# Patient Record
Sex: Female | Born: 1965 | Race: Black or African American | Hispanic: No | Marital: Single | Smoking: Never smoker
Health system: Southern US, Community
[De-identification: ages and names within clinical notes are randomized; demographics above are authoritative.]

## PROBLEM LIST (undated history)

## (undated) DIAGNOSIS — I10 Essential (primary) hypertension: Secondary | ICD-10-CM

## (undated) DIAGNOSIS — F419 Anxiety disorder, unspecified: Secondary | ICD-10-CM

## (undated) DIAGNOSIS — E119 Type 2 diabetes mellitus without complications: Secondary | ICD-10-CM

## (undated) DIAGNOSIS — N390 Urinary tract infection, site not specified: Secondary | ICD-10-CM

## (undated) HISTORY — PX: TOE SURGERY: SHX1073

## (undated) HISTORY — PX: CHOLECYSTECTOMY: SHX55

---

## 1998-06-18 ENCOUNTER — Other Ambulatory Visit: Admission: RE | Admit: 1998-06-18 | Discharge: 1998-06-18 | Payer: Self-pay | Admitting: Gynecology

## 1999-07-14 ENCOUNTER — Other Ambulatory Visit: Admission: RE | Admit: 1999-07-14 | Discharge: 1999-07-14 | Payer: Self-pay | Admitting: Gynecology

## 1999-12-16 ENCOUNTER — Other Ambulatory Visit: Admission: RE | Admit: 1999-12-16 | Discharge: 1999-12-16 | Payer: Self-pay | Admitting: Gynecology

## 2000-07-31 ENCOUNTER — Emergency Department (HOSPITAL_COMMUNITY): Admission: EM | Admit: 2000-07-31 | Discharge: 2000-08-01 | Payer: Self-pay | Admitting: Emergency Medicine

## 2000-08-02 ENCOUNTER — Encounter: Admission: RE | Admit: 2000-08-02 | Discharge: 2000-08-02 | Payer: Self-pay | Admitting: Internal Medicine

## 2000-08-02 ENCOUNTER — Encounter: Payer: Self-pay | Admitting: Internal Medicine

## 2000-08-09 ENCOUNTER — Other Ambulatory Visit: Admission: RE | Admit: 2000-08-09 | Discharge: 2000-08-09 | Payer: Self-pay | Admitting: Gynecology

## 2001-08-22 ENCOUNTER — Ambulatory Visit (HOSPITAL_COMMUNITY): Admission: RE | Admit: 2001-08-22 | Discharge: 2001-08-22 | Payer: Self-pay | Admitting: Gastroenterology

## 2001-09-11 ENCOUNTER — Other Ambulatory Visit: Admission: RE | Admit: 2001-09-11 | Discharge: 2001-09-11 | Payer: Self-pay | Admitting: Gynecology

## 2002-03-11 ENCOUNTER — Encounter: Admission: RE | Admit: 2002-03-11 | Discharge: 2002-06-09 | Payer: Self-pay | Admitting: Internal Medicine

## 2002-10-07 ENCOUNTER — Other Ambulatory Visit: Admission: RE | Admit: 2002-10-07 | Discharge: 2002-10-07 | Payer: Self-pay | Admitting: Gynecology

## 2002-10-10 ENCOUNTER — Encounter: Admission: RE | Admit: 2002-10-10 | Discharge: 2002-10-10 | Payer: Self-pay | Admitting: Gynecology

## 2002-10-10 ENCOUNTER — Encounter: Payer: Self-pay | Admitting: Gynecology

## 2002-11-15 ENCOUNTER — Emergency Department (HOSPITAL_COMMUNITY): Admission: EM | Admit: 2002-11-15 | Discharge: 2002-11-15 | Payer: Self-pay | Admitting: Emergency Medicine

## 2006-07-14 ENCOUNTER — Ambulatory Visit: Payer: Self-pay | Admitting: Nurse Practitioner

## 2006-08-04 ENCOUNTER — Ambulatory Visit: Payer: Self-pay | Admitting: Nurse Practitioner

## 2006-08-04 ENCOUNTER — Encounter (INDEPENDENT_AMBULATORY_CARE_PROVIDER_SITE_OTHER): Payer: Self-pay | Admitting: Nurse Practitioner

## 2006-08-09 ENCOUNTER — Ambulatory Visit (HOSPITAL_COMMUNITY): Admission: RE | Admit: 2006-08-09 | Discharge: 2006-08-09 | Payer: Self-pay | Admitting: Family Medicine

## 2006-08-10 ENCOUNTER — Ambulatory Visit: Payer: Self-pay | Admitting: *Deleted

## 2008-05-06 ENCOUNTER — Encounter (INDEPENDENT_AMBULATORY_CARE_PROVIDER_SITE_OTHER): Payer: Self-pay | Admitting: Internal Medicine

## 2008-05-06 ENCOUNTER — Ambulatory Visit: Payer: Self-pay | Admitting: Internal Medicine

## 2008-05-06 LAB — CONVERTED CEMR LAB
Cholesterol: 192 mg/dL (ref 0–200)
HDL: 72 mg/dL (ref >39)
LDL Cholesterol: 107 mg/dL — ABNORMAL HIGH (ref 0–99)
Microalb, Ur: 1.93 mg/dL — ABNORMAL HIGH (ref 0.00–1.89)
TSH: 1.939 microintl units/mL (ref 0.350–4.50)
Total CHOL/HDL Ratio: 2.7
Triglycerides: 64 mg/dL (ref <150)
VLDL: 13 mg/dL (ref 0–40)

## 2008-06-06 ENCOUNTER — Ambulatory Visit (HOSPITAL_COMMUNITY): Admission: RE | Admit: 2008-06-06 | Discharge: 2008-06-06 | Payer: Self-pay | Admitting: Internal Medicine

## 2008-12-04 ENCOUNTER — Ambulatory Visit: Payer: Self-pay | Admitting: Internal Medicine

## 2008-12-05 ENCOUNTER — Ambulatory Visit: Payer: Self-pay | Admitting: Internal Medicine

## 2008-12-05 ENCOUNTER — Encounter (INDEPENDENT_AMBULATORY_CARE_PROVIDER_SITE_OTHER): Payer: Self-pay | Admitting: Family Medicine

## 2008-12-05 LAB — CONVERTED CEMR LAB
ALT: 20 units/L (ref 0–35)
AST: 21 units/L (ref 0–37)
Albumin: 4.2 g/dL (ref 3.5–5.2)
Alkaline Phosphatase: 52 units/L (ref 39–117)
BUN: 14 mg/dL (ref 6–23)
CO2: 21 meq/L (ref 19–32)
Calcium: 9 mg/dL (ref 8.4–10.5)
Chloride: 105 meq/L (ref 96–112)
Creatinine, Ser: 0.99 mg/dL (ref 0.40–1.20)
Glucose, Bld: 92 mg/dL (ref 70–99)
Potassium: 4.2 meq/L (ref 3.5–5.3)
Sodium: 139 meq/L (ref 135–145)
Total Bilirubin: 0.3 mg/dL (ref 0.3–1.2)
Total Protein: 6.9 g/dL (ref 6.0–8.3)

## 2009-05-15 ENCOUNTER — Ambulatory Visit: Payer: Self-pay | Admitting: Family Medicine

## 2009-09-14 ENCOUNTER — Emergency Department (HOSPITAL_COMMUNITY): Admission: EM | Admit: 2009-09-14 | Discharge: 2009-09-14 | Payer: Self-pay | Admitting: Emergency Medicine

## 2010-05-23 LAB — DIFFERENTIAL
Basophils Relative: 1 % (ref 0–1)
Eosinophils Relative: 6 % — ABNORMAL HIGH (ref 0–5)
Lymphocytes Relative: 36 % (ref 12–46)
Monocytes Absolute: 0.5 10*3/uL (ref 0.1–1.0)
Monocytes Relative: 11 % (ref 3–12)
Neutro Abs: 2.3 10*3/uL (ref 1.7–7.7)

## 2010-05-23 LAB — CBC
HCT: 38.2 % (ref 36.0–46.0)
Hemoglobin: 13.1 g/dL (ref 12.0–15.0)
MCHC: 34.2 g/dL (ref 30.0–36.0)
MCV: 86.4 fL (ref 78.0–100.0)

## 2010-05-23 LAB — POCT I-STAT, CHEM 8
Calcium, Ion: 1.17 mmol/L (ref 1.12–1.32)
Chloride: 106 mEq/L (ref 96–112)
Creatinine, Ser: 0.9 mg/dL (ref 0.4–1.2)
Glucose, Bld: 94 mg/dL (ref 70–99)
Potassium: 4.1 mEq/L (ref 3.5–5.1)

## 2010-05-23 LAB — URINALYSIS, ROUTINE W REFLEX MICROSCOPIC
Glucose, UA: NEGATIVE mg/dL
Hgb urine dipstick: NEGATIVE
Ketones, ur: NEGATIVE mg/dL
pH: 6 (ref 5.0–8.0)

## 2010-05-23 LAB — GLUCOSE, CAPILLARY: Glucose-Capillary: 100 mg/dL — ABNORMAL HIGH (ref 70–99)

## 2010-06-25 ENCOUNTER — Emergency Department (HOSPITAL_COMMUNITY)
Admission: EM | Admit: 2010-06-25 | Discharge: 2010-06-25 | Disposition: A | Discharge status: Home / Self Care | Payer: No Typology Code available for payment source | Attending: Emergency Medicine | Admitting: Emergency Medicine

## 2010-06-25 DIAGNOSIS — I10 Essential (primary) hypertension: Secondary | ICD-10-CM | POA: Insufficient documentation

## 2010-06-25 DIAGNOSIS — E119 Type 2 diabetes mellitus without complications: Secondary | ICD-10-CM | POA: Insufficient documentation

## 2010-06-25 DIAGNOSIS — M62838 Other muscle spasm: Secondary | ICD-10-CM | POA: Insufficient documentation

## 2010-06-25 DIAGNOSIS — M549 Dorsalgia, unspecified: Secondary | ICD-10-CM | POA: Insufficient documentation

## 2010-06-25 DIAGNOSIS — M542 Cervicalgia: Secondary | ICD-10-CM | POA: Insufficient documentation

## 2010-06-25 DIAGNOSIS — Y92410 Unspecified street and highway as the place of occurrence of the external cause: Secondary | ICD-10-CM | POA: Insufficient documentation

## 2010-06-25 DIAGNOSIS — T1490XA Injury, unspecified, initial encounter: Secondary | ICD-10-CM | POA: Insufficient documentation

## 2010-07-16 ENCOUNTER — Emergency Department (HOSPITAL_COMMUNITY)
Admission: EM | Admit: 2010-07-16 | Discharge: 2010-07-16 | Disposition: A | Discharge status: Home / Self Care | Payer: No Typology Code available for payment source | Attending: Emergency Medicine | Admitting: Emergency Medicine

## 2010-07-16 ENCOUNTER — Emergency Department (HOSPITAL_COMMUNITY): Payer: No Typology Code available for payment source

## 2010-07-16 DIAGNOSIS — E119 Type 2 diabetes mellitus without complications: Secondary | ICD-10-CM | POA: Insufficient documentation

## 2010-07-16 DIAGNOSIS — M47817 Spondylosis without myelopathy or radiculopathy, lumbosacral region: Secondary | ICD-10-CM | POA: Insufficient documentation

## 2010-07-16 DIAGNOSIS — I10 Essential (primary) hypertension: Secondary | ICD-10-CM | POA: Insufficient documentation

## 2010-07-16 DIAGNOSIS — M5430 Sciatica, unspecified side: Secondary | ICD-10-CM | POA: Insufficient documentation

## 2010-07-16 LAB — URINALYSIS, ROUTINE W REFLEX MICROSCOPIC
Nitrite: NEGATIVE
Specific Gravity, Urine: 1.011 (ref 1.005–1.030)
pH: 5.5 (ref 5.0–8.0)

## 2010-07-16 LAB — POCT PREGNANCY, URINE: Preg Test, Ur: NEGATIVE

## 2010-07-23 NOTE — Procedures (Signed)
[place]  Patient:    [name], Vickie Marshall Visit Number: [id number] MRN: [id number]          Service Type: END Location: ENDO Attending Physician:  Charna Elizabeth Dictated by:   Anselmo Rod, M.D. Proc. Date: 08/22/01 Admit Date:  08/22/2001 Discharge Date: 08/22/2001   CC:         Earlene Plater L. Cloward, M.D., [facility], [facility] [address]   Procedure Report  DATE OF BIRTH:  [birth year].  PROCEDURE:  Esophagogastroduodenoscopy.  ENDOSCOPIST:  Anselmo Rod, M.D.  INSTRUMENT USED:  Olympus video panendoscope.  INDICATION FOR PROCEDURE:  Epigastric pain and a history of melena in a 45 year old African-American female.  Rule out peptic ulcer disease, esophagitis, gastritis, etc.  PREPROCEDURE PREPARATION:  Informed consent was procured from the patient. The patient was fasted for eight hours prior to the procedure.  PREPROCEDURE PHYSICAL:  VITAL SIGNS:  The patient had stable vital signs.  NECK:  Supple.  CHEST:  Clear to auscultation.  S1, S2 regular.  ABDOMEN:  Soft with normal bowel sounds.  DESCRIPTION OF PROCEDURE:  The patient was placed in the left lateral decubitus position and sedated with 40 mg of Demerol and 5 mg of Versed intravenously.  Once the patient was adequately sedate and maintained on low-flow oxygen and continuous cardiac monitoring, the Olympus video panendoscope was advanced through the mouthpiece, over the tongue, into the esophagus under direct vision.  The entire esophagus appeared normal without evidence of ring, stricture, masses, esophagitis, or Barretts mucosa.  The scope was then advanced to the stomach.  There was moderate diffuse gastritis throughout the gastric mucosa.  No ulcers, erosions, masses, or polyps were seen, and the proximal small bowel appeared normal.  IMPRESSION: 1. Normal EGD and proximal small bowel. 2. Moderate diffuse gastritis, no ulcer seen.  No evidence of bleeding  identified.  RECOMMENDATIONS: 1. Continue PPIs for now. 2. Avoid all nonsteroidals, including aspirin. 3. Outpatient follow-up in the next two weeks. Dictated by:   Anselmo Rod, M.D. Attending Physician:  Charna Elizabeth DD:  08/22/01 TD:  08/24/01 Job: [id number]

## 2011-11-01 ENCOUNTER — Other Ambulatory Visit: Payer: Self-pay | Admitting: Internal Medicine

## 2011-11-01 DIAGNOSIS — M542 Cervicalgia: Secondary | ICD-10-CM

## 2011-11-04 ENCOUNTER — Other Ambulatory Visit: Payer: Self-pay | Admitting: Internal Medicine

## 2011-11-04 ENCOUNTER — Ambulatory Visit
Admission: RE | Admit: 2011-11-04 | Discharge: 2011-11-04 | Disposition: A | Discharge status: Home / Self Care | Payer: BC Managed Care – PPO | Source: Ambulatory Visit | Attending: Internal Medicine | Admitting: Internal Medicine

## 2011-11-04 DIAGNOSIS — M542 Cervicalgia: Secondary | ICD-10-CM

## 2013-07-30 ENCOUNTER — Other Ambulatory Visit: Payer: Self-pay | Admitting: Obstetrics and Gynecology

## 2013-08-08 ENCOUNTER — Ambulatory Visit: Payer: Self-pay | Admitting: Podiatry

## 2013-10-16 ENCOUNTER — Encounter (HOSPITAL_COMMUNITY): Payer: Self-pay

## 2013-10-16 ENCOUNTER — Inpatient Hospital Stay (HOSPITAL_COMMUNITY): Admit: 2013-10-16 | Payer: BC Managed Care – PPO | Admitting: Obstetrics and Gynecology

## 2013-10-16 SURGERY — MYOMECTOMY, ABDOMINAL APPROACH
Anesthesia: General

## 2015-10-28 ENCOUNTER — Encounter: Payer: Self-pay | Admitting: Internal Medicine

## 2016-01-17 ENCOUNTER — Encounter (HOSPITAL_COMMUNITY): Payer: Self-pay

## 2016-01-17 ENCOUNTER — Emergency Department (HOSPITAL_COMMUNITY): Payer: Self-pay

## 2016-01-17 ENCOUNTER — Emergency Department (HOSPITAL_COMMUNITY)
Admission: EM | Admit: 2016-01-17 | Discharge: 2016-01-17 | Disposition: A | Discharge status: Home / Self Care | Payer: Self-pay | Attending: Emergency Medicine | Admitting: Emergency Medicine

## 2016-01-17 DIAGNOSIS — Y929 Unspecified place or not applicable: Secondary | ICD-10-CM | POA: Insufficient documentation

## 2016-01-17 DIAGNOSIS — E119 Type 2 diabetes mellitus without complications: Secondary | ICD-10-CM | POA: Insufficient documentation

## 2016-01-17 DIAGNOSIS — X58XXXA Exposure to other specified factors, initial encounter: Secondary | ICD-10-CM | POA: Insufficient documentation

## 2016-01-17 DIAGNOSIS — Y9339 Activity, other involving climbing, rappelling and jumping off: Secondary | ICD-10-CM | POA: Insufficient documentation

## 2016-01-17 DIAGNOSIS — I10 Essential (primary) hypertension: Secondary | ICD-10-CM | POA: Insufficient documentation

## 2016-01-17 DIAGNOSIS — M79671 Pain in right foot: Secondary | ICD-10-CM

## 2016-01-17 DIAGNOSIS — Y999 Unspecified external cause status: Secondary | ICD-10-CM | POA: Insufficient documentation

## 2016-01-17 HISTORY — DX: Essential (primary) hypertension: I10

## 2016-01-17 HISTORY — DX: Type 2 diabetes mellitus without complications: E11.9

## 2016-01-17 HISTORY — DX: Anxiety disorder, unspecified: F41.9

## 2016-01-17 MED ORDER — NAPROXEN 500 MG PO TABS: 500.0000 mg | ORAL_TABLET | Freq: Two times a day (BID) | ORAL | 0 refills | Status: DC

## 2016-01-17 NOTE — ED Triage Notes (Signed)
Pt here with foot pain to rt side.  Jumped off dryer and having pain. Occurred last night.

## 2016-01-17 NOTE — ED Provider Notes (Signed)
[facility] DEPT Provider Note   CSN: [id number] Arrival date & time: 01/17/16  1156  By signing my name below, I, Hansel Feinstein, attest that this documentation has been prepared under the direction and in the presence of Quincy Carnes, PA-C. Electronically Signed: Hansel Feinstein, ED Scribe. 01/17/16. 12:55 PM.    History   Chief Complaint Chief Complaint  Patient presents with  . Foot Pain    HPI Vickie Marshall is a 50 y.o. female who presents to the Emergency Department complaining of moderate right foot pain s/p injury that occurred last night. Pt states she jumped down off her dryer and landed directly on the right foot, causing her pain. She denies falls, LOC or head injury. Pt is ambulatory with minimal difficulty secondary to pain. She states her pain is worsened with ambulation, weight-bearing and movement. Pt states she has taken ibuprofen without relief of pain. She denies swelling, additional injuries.    The history is provided by the patient. No language interpreter was used.    Past Medical History:  Diagnosis Date  . Anxiety   . Diabetes mellitus without complication ([place])   . Hypertension     There are no active problems to display for this patient.   Past Surgical History:  Procedure Laterality Date  . CHOLECYSTECTOMY      OB History    No data available       Home Medications    Prior to Admission medications   Not on File    Family History History reviewed. No pertinent family history.  Social History Social History  Substance Use Topics  . Smoking status: Never Smoker  . Smokeless tobacco: Never Used  . Alcohol use No     Allergies   Codeine and Eggs or egg-derived products   Review of Systems Review of Systems  Musculoskeletal: Positive for arthralgias. Negative for joint swelling.  Neurological: Negative for syncope.  All other systems reviewed and are negative.    Physical Exam Updated Vital Signs BP 131/79 (BP Location:  Right Arm)   Pulse 82   Temp 97.6 F (36.4 C) (Oral)   Resp 18   LMP 01/10/2016   SpO2 100%   Physical Exam  Constitutional: She is oriented to person, place, and time. She appears well-developed and well-nourished.  HENT:  Head: Normocephalic and atraumatic.  Mouth/Throat: Oropharynx is clear and moist.  Eyes: Conjunctivae and EOM are normal. Pupils are equal, round, and reactive to light.  Neck: Normal range of motion.  Cardiovascular: Normal rate, regular rhythm and normal heart sounds.   Pulmonary/Chest: Effort normal and breath sounds normal.  Abdominal: Soft. Bowel sounds are normal.  Musculoskeletal: Normal range of motion.  Right foot overall normal in appearance, no gross deformities or significant swelling; no bruising or signs of trauma; full ROM of ankles and all toes; normal sensation throughout  Neurological: She is alert and oriented to person, place, and time.  Skin: Skin is warm and dry.  Psychiatric: She has a normal mood and affect.  Nursing note and vitals reviewed.    ED Treatments / Results   DIAGNOSTIC STUDIES: Oxygen Saturation is 100% on RA, normal by my interpretation.    COORDINATION OF CARE: 12:53 PM Discussed treatment plan with pt at bedside which includes XR, RICE therapy and pt agreed to plan.    Labs (all labs ordered are listed, but only abnormal results are displayed) Labs Reviewed - No data to display  EKG  EKG Interpretation None  Radiology Dg Foot Complete Right  Result Date: 01/17/2016 CLINICAL DATA:  Right foot pain EXAM: RIGHT FOOT COMPLETE - 3+ VIEW COMPARISON:  None FINDINGS: There is no evidence of fracture or dislocation. Small plantar heel spur. There is no evidence of arthropathy or other focal bone abnormality. Soft tissues are unremarkable. IMPRESSION: 1. No acute bone abnormality. 2. Heel spur. Electronically Signed   By: Kerby Moors M.D.   On: 01/17/2016 12:46    Procedures Procedures (including critical  care time)  Medications Ordered in ED Medications - No data to display   Initial Impression / Assessment and Plan / ED Course  I have reviewed the triage vital signs and the nursing notes.  Pertinent labs & imaging results that were available during my care of the patient were reviewed by me and considered in my medical decision making (see chart for details).  Clinical Course    51 year old female who with right foot pain. States she was trying to fix her dryer last night and jumped down off the top of it and injured her right foot. No head injury loss of consciousness. On exam right foot is overall normal in appearance. There is no gross deformity or visible signs of trauma. She remains ambulatory with a steady gait.  x-ray negative for acute bony findings, there is incidental finding of right heel spur which is non-tender on exam. Will start on anti-inflammatories. Recommended to elevate foot to help with pain and/or swelling. Follow-up with PCP if no improvement in the next few days to 1 week.  Discussed plan with patient, she acknowledged understanding and agreed with plan of care.  Return precautions given for new or worsening symptoms.  Final Clinical Impressions(s) / ED Diagnoses   Final diagnoses:  Foot pain, right    New Prescriptions Discharge Medication List as of 01/17/2016 12:57 PM    START taking these medications   Details  naproxen (NAPROSYN) 500 MG tablet Take 1 tablet (500 mg total) by mouth 2 (two) times daily with a meal., Starting Sun 01/17/2016, Print        I personally performed the services described in this documentation, which was scribed in my presence. The recorded information has been reviewed and is accurate.    Larene Pickett, PA-C 01/17/16 Garwin Liu, MD 01/17/16 2126

## 2016-01-17 NOTE — ED Notes (Addendum)
Pt refused DC vitals.  Signature received by pt.

## 2016-01-17 NOTE — Discharge Instructions (Signed)
Take the prescribed medication as directed.  Recommend to elevate your foot when at home to help with swelling. Follow-up with your primary care doctor if does not seem to be improving in the next few days to 1 week. Return to the ED for new or worsening symptoms.

## 2016-10-28 ENCOUNTER — Encounter (HOSPITAL_COMMUNITY): Payer: Self-pay | Admitting: Emergency Medicine

## 2016-10-28 ENCOUNTER — Emergency Department (HOSPITAL_COMMUNITY): Payer: Self-pay

## 2016-10-28 ENCOUNTER — Emergency Department (HOSPITAL_COMMUNITY)
Admission: EM | Admit: 2016-10-28 | Discharge: 2016-10-28 | Disposition: A | Discharge status: Home / Self Care | Payer: Self-pay | Attending: Emergency Medicine | Admitting: Emergency Medicine

## 2016-10-28 DIAGNOSIS — Z79899 Other long term (current) drug therapy: Secondary | ICD-10-CM | POA: Insufficient documentation

## 2016-10-28 DIAGNOSIS — Z7984 Long term (current) use of oral hypoglycemic drugs: Secondary | ICD-10-CM | POA: Insufficient documentation

## 2016-10-28 DIAGNOSIS — I10 Essential (primary) hypertension: Secondary | ICD-10-CM | POA: Insufficient documentation

## 2016-10-28 DIAGNOSIS — E119 Type 2 diabetes mellitus without complications: Secondary | ICD-10-CM | POA: Insufficient documentation

## 2016-10-28 DIAGNOSIS — R079 Chest pain, unspecified: Secondary | ICD-10-CM | POA: Insufficient documentation

## 2016-10-28 LAB — BASIC METABOLIC PANEL
ANION GAP: 9 (ref 5–15)
BUN: 15 mg/dL (ref 6–20)
CALCIUM: 9.9 mg/dL (ref 8.9–10.3)
CO2: 27 mmol/L (ref 22–32)
CREATININE: 0.91 mg/dL (ref 0.44–1.00)
Chloride: 102 mmol/L (ref 101–111)
GLUCOSE: 207 mg/dL — AB (ref 65–99)
Potassium: 4 mmol/L (ref 3.5–5.1)
Sodium: 138 mmol/L (ref 135–145)

## 2016-10-28 LAB — I-STAT TROPONIN, ED: TROPONIN I, POC: 0 ng/mL (ref 0.00–0.08)

## 2016-10-28 LAB — CBC
HCT: 39.8 % (ref 36.0–46.0)
HEMOGLOBIN: 13.1 g/dL (ref 12.0–15.0)
MCH: 29.3 pg (ref 26.0–34.0)
MCHC: 32.9 g/dL (ref 30.0–36.0)
MCV: 89 fL (ref 78.0–100.0)
PLATELETS: 260 10*3/uL (ref 150–400)
RBC: 4.47 MIL/uL (ref 3.87–5.11)
RDW: 13.2 % (ref 11.5–15.5)
WBC: 6.2 10*3/uL (ref 4.0–10.5)

## 2016-10-28 LAB — D-DIMER, QUANTITATIVE (NOT AT ARMC): D DIMER QUANT: 0.45 ug{FEU}/mL (ref 0.00–0.50)

## 2016-10-28 LAB — TROPONIN I: Troponin I: <0.03 ng/mL (ref <0.03)

## 2016-10-28 LAB — CK: CK TOTAL: 191 U/L (ref 38–234)

## 2016-10-28 MED ORDER — KETOROLAC TROMETHAMINE 60 MG/2ML IM SOLN
30.0000 mg | Freq: Once | INTRAMUSCULAR | Status: AC
  Administered 2016-10-28: 30 mg via INTRAMUSCULAR

## 2016-10-28 MED ORDER — KETOROLAC TROMETHAMINE 30 MG/ML IJ SOLN
30.0000 mg | Freq: Once | INTRAMUSCULAR | Status: DC
  Filled 2016-10-28: qty 1

## 2016-10-28 MED ORDER — IBUPROFEN 800 MG PO TABS: 800.0000 mg | ORAL_TABLET | Freq: Three times a day (TID) | ORAL | 0 refills | Status: DC

## 2016-10-28 MED ORDER — HYDROCODONE-ACETAMINOPHEN 5-325 MG PO TABS: 1.0000 | ORAL_TABLET | Freq: Four times a day (QID) | ORAL | 0 refills | Status: DC | PRN

## 2016-10-28 MED ORDER — FENTANYL CITRATE (PF) 100 MCG/2ML IJ SOLN
50.0000 ug | Freq: Once | INTRAMUSCULAR | Status: DC
  Filled 2016-10-28: qty 2

## 2016-10-28 MED ORDER — HYDROCODONE-ACETAMINOPHEN 5-325 MG PO TABS
1.0000 | ORAL_TABLET | ORAL | Status: DC | PRN
  Administered 2016-10-28: 1 via ORAL
  Filled 2016-10-28: qty 1

## 2016-10-28 NOTE — ED Notes (Signed)
Dr. Dayna Barker at bedside with this RN

## 2016-10-28 NOTE — ED Provider Notes (Signed)
[facility] DEPT Provider Note   CSN: [id number] Arrival date & time: 10/28/16  4332     History   Chief Complaint Chief Complaint  Patient presents with  . Chest Pain    HPI Vickie Marshall is a 51 y.o. female.   Chest Pain   This is a new problem. The current episode started yesterday. The problem occurs constantly. The problem has not changed since onset.Associated with: deep breath. The pain is present in the substernal region. The pain is moderate. The quality of the pain is described as sharp. The pain does not radiate. Pertinent negatives include no abdominal pain and no back pain. She has tried nothing for the symptoms. The treatment provided no relief.    Past Medical History:  Diagnosis Date  . Anxiety   . Diabetes mellitus without complication ([place])   . Hypertension     There are no active problems to display for this patient.   Past Surgical History:  Procedure Laterality Date  . CHOLECYSTECTOMY      OB History    No data available       Home Medications    Prior to Admission medications   Medication Sig Start Date End Date Taking? Authorizing Provider  cetirizine (ZYRTEC) 10 MG tablet Take 10 mg by mouth daily.   Yes [provider]  ferrous sulfate 325 (65 FE) MG tablet Take 325 mg by mouth daily with breakfast.   Yes [provider]  lisinopril (PRINIVIL,ZESTRIL) 10 MG tablet Take 10 mg by mouth daily.   Yes [provider]  metFORMIN (GLUCOPHAGE) 500 MG tablet Take 500 mg by mouth daily with breakfast.   Yes [provider]  Multiple Vitamin (MULTIVITAMIN) tablet Take 1 tablet by mouth daily.   Yes [provider]  HYDROcodone-acetaminophen (NORCO/VICODIN) 5-325 MG tablet Take 1 tablet by mouth every 6 (six) hours as needed for severe pain. 10/28/16   Maeby Vankleeck, Corene Cornea, MD  ibuprofen (ADVIL,MOTRIN) 800 MG tablet Take 1 tablet (800 mg total) by mouth 3 (three) times daily. 10/28/16   Corleen Otwell, Corene Cornea, MD     Family History No family history on file.  Social History Social History  Substance Use Topics  . Smoking status: Never Smoker  . Smokeless tobacco: Never Used  . Alcohol use No     Allergies   Asparagus; Codeine; and Eggs or egg-derived products   Review of Systems Review of Systems  Cardiovascular: Positive for chest pain.  Gastrointestinal: Negative for abdominal pain.  Musculoskeletal: Negative for back pain.  All other systems reviewed and are negative.    Physical Exam Updated Vital Signs BP (!) 123/91   Pulse 87   Temp 98.2 F (36.8 C) (Oral)   Resp 20   LMP 09/23/2016   SpO2 100%   Physical Exam  Constitutional: She is oriented to person, place, and time. She appears well-developed and well-nourished.  HENT:  Head: Normocephalic and atraumatic.  Eyes: Conjunctivae and EOM are normal.  Neck: Normal range of motion.  Cardiovascular: Normal rate and regular rhythm.   Pulmonary/Chest: No stridor. No respiratory distress.  Abdominal: Soft. Bowel sounds are normal. She exhibits no distension.  Neurological: She is alert and oriented to person, place, and time. No cranial nerve deficit. Coordination normal.  Skin: Skin is warm and dry.  Nursing note and vitals reviewed.    ED Treatments / Results  Labs (all labs ordered are listed, but only abnormal results are displayed) Labs Reviewed  BASIC METABOLIC PANEL -  Abnormal; Notable for the following:       Result Value   Glucose, Bld 207 (*)    All other components within normal limits  CBC  D-DIMER, QUANTITATIVE (NOT AT [facility])  CK  TROPONIN I  I-STAT TROPONIN, ED    EKG  EKG Interpretation  Date/Time:  Friday October 28 2016 10:16:03 EDT Ventricular Rate:  102 PR Interval:  146 QRS Duration: 70 QT Interval:  346 QTC Calculation: 450 R Axis:   63 Text Interpretation:  Sinus tachycardia Otherwise normal ECG Confirmed by Thayer Jew [phone]) on 10/29/2016 3:01:40 PM       Radiology Dg  Chest 2 View  Result Date: 10/28/2016 CLINICAL DATA:  51 year old female with left arm pain and chest pain. EXAM: CHEST  2 VIEW COMPARISON:  None. FINDINGS: The heart size and mediastinal contours are within normal limits. Both lungs are clear. The visualized skeletal structures are unremarkable. IMPRESSION: No active cardiopulmonary disease. Electronically Signed   By: Kristopher Oppenheim M.D.   On: 10/28/2016 10:52    Procedures Procedures (including critical care time)  Medications Ordered in ED Medications  ketorolac (TORADOL) injection 30 mg (30 mg Intramuscular Given 10/28/16 1343)     Initial Impression / Assessment and Plan / ED Course  I have reviewed the triage vital signs and the nursing notes.  Pertinent labs & imaging results that were available during my care of the patient were reviewed by me and considered in my medical decision making (see chart for details).     Pleuritic cp, suspect inflammation. Less likely PE, ACS, pneumonia, pneumothorax or other acute emergent causes for symptoms.   Final Clinical Impressions(s) / ED Diagnoses   Final diagnoses:  Nonspecific chest pain    New Prescriptions Discharge Medication List as of 10/28/2016  4:29 PM    START taking these medications   Details  HYDROcodone-acetaminophen (NORCO/VICODIN) 5-325 MG tablet Take 1 tablet by mouth every 6 (six) hours as needed for severe pain., Starting Fri 10/28/2016, Print         Shalicia Craghead, Corene Cornea, MD 10/30/16 [id number]

## 2016-10-28 NOTE — ED Triage Notes (Signed)
Pt reports pain behind left breast that radiates to left shoulder and down left arm x2 days. Denies sob, resp e/u, nad.

## 2016-12-12 ENCOUNTER — Ambulatory Visit (INDEPENDENT_AMBULATORY_CARE_PROVIDER_SITE_OTHER): Payer: Self-pay | Admitting: Family Medicine

## 2016-12-12 ENCOUNTER — Encounter: Payer: Self-pay | Admitting: Family Medicine

## 2016-12-12 ENCOUNTER — Other Ambulatory Visit (HOSPITAL_COMMUNITY)
Admission: RE | Admit: 2016-12-12 | Discharge: 2016-12-12 | Disposition: A | Discharge status: Home / Self Care | Payer: Self-pay | Source: Ambulatory Visit | Attending: Family Medicine | Admitting: Family Medicine

## 2016-12-12 ENCOUNTER — Telehealth: Payer: Self-pay | Admitting: Family Medicine

## 2016-12-12 VITALS — BP 135/75 | HR 90 | Temp 98.1°F | Resp 16 | Ht 60.0 in | Wt 186.0 lb

## 2016-12-12 DIAGNOSIS — Z13220 Encounter for screening for lipoid disorders: Secondary | ICD-10-CM

## 2016-12-12 DIAGNOSIS — D219 Benign neoplasm of connective and other soft tissue, unspecified: Secondary | ICD-10-CM

## 2016-12-12 DIAGNOSIS — E119 Type 2 diabetes mellitus without complications: Secondary | ICD-10-CM

## 2016-12-12 DIAGNOSIS — N898 Other specified noninflammatory disorders of vagina: Secondary | ICD-10-CM | POA: Insufficient documentation

## 2016-12-12 DIAGNOSIS — N926 Irregular menstruation, unspecified: Secondary | ICD-10-CM

## 2016-12-12 DIAGNOSIS — I10 Essential (primary) hypertension: Secondary | ICD-10-CM

## 2016-12-12 DIAGNOSIS — F329 Major depressive disorder, single episode, unspecified: Secondary | ICD-10-CM

## 2016-12-12 LAB — COMPLETE METABOLIC PANEL WITH GFR
AG Ratio: 1.5 (calc) (ref 1.0–2.5)
ALBUMIN MSPROF: 4 g/dL (ref 3.6–5.1)
ALKALINE PHOSPHATASE (APISO): 48 U/L (ref 33–130)
ALT: 17 U/L (ref 6–29)
AST: 17 U/L (ref 10–35)
BUN: 21 mg/dL (ref 7–25)
CO2: 28 mmol/L (ref 20–32)
CREATININE: 0.91 mg/dL (ref 0.50–1.05)
Calcium: 8.9 mg/dL (ref 8.6–10.4)
Chloride: 103 mmol/L (ref 98–110)
GFR, Est African American: 85 mL/min/{1.73_m2} (ref >=60)
GFR, Est Non African American: 74 mL/min/{1.73_m2} (ref >=60)
GLUCOSE: 117 mg/dL — AB (ref 65–99)
Globulin: 2.6 g/dL (calc) (ref 1.9–3.7)
Potassium: 4.1 mmol/L (ref 3.5–5.3)
Sodium: 139 mmol/L (ref 135–146)
Total Bilirubin: 0.3 mg/dL (ref 0.2–1.2)
Total Protein: 6.6 g/dL (ref 6.1–8.1)

## 2016-12-12 LAB — CBC WITH DIFFERENTIAL/PLATELET
BASOS ABS: 49 {cells}/uL (ref 0–200)
Basophils Relative: 1 %
EOS PCT: 3.9 %
Eosinophils Absolute: 191 cells/uL (ref 15–500)
HCT: 35.1 % (ref 35.0–45.0)
HEMOGLOBIN: 11.9 g/dL (ref 11.7–15.5)
Lymphs Abs: 2019 cells/uL (ref 850–3900)
MCH: 29.4 pg (ref 27.0–33.0)
MCHC: 33.9 g/dL (ref 32.0–36.0)
MCV: 86.7 fL (ref 80.0–100.0)
MONOS PCT: 10.1 %
MPV: 9.7 fL (ref 7.5–12.5)
NEUTROS ABS: 2146 {cells}/uL (ref 1500–7800)
Neutrophils Relative %: 43.8 %
Platelets: 236 10*3/uL (ref 140–400)
RBC: 4.05 10*6/uL (ref 3.80–5.10)
RDW: 13.1 % (ref 11.0–15.0)
Total Lymphocyte: 41.2 %
WBC mixed population: 495 cells/uL (ref 200–950)
WBC: 4.9 10*3/uL (ref 3.8–10.8)

## 2016-12-12 LAB — POCT URINALYSIS DIP (DEVICE)
BILIRUBIN URINE: NEGATIVE
Glucose, UA: NEGATIVE mg/dL
KETONES UR: NEGATIVE mg/dL
LEUKOCYTES UA: NEGATIVE
Nitrite: NEGATIVE
Protein, ur: NEGATIVE mg/dL
SPECIFIC GRAVITY, URINE: 1.025 (ref 1.005–1.030)
Urobilinogen, UA: 0.2 mg/dL (ref 0.0–1.0)
pH: 6.5 (ref 5.0–8.0)

## 2016-12-12 LAB — IRON,TIBC AND FERRITIN PANEL
%SAT: 19 % (calc) (ref 11–50)
Ferritin: 26 ng/mL (ref 10–232)
Iron: 67 ug/dL (ref 45–160)
TIBC: 353 mcg/dL (calc) (ref 250–450)

## 2016-12-12 LAB — POCT GLYCOSYLATED HEMOGLOBIN (HGB A1C): Hemoglobin A1C: 6

## 2016-12-12 LAB — LIPID PANEL
CHOL/HDL RATIO: 2.5 (calc) (ref <5.0)
CHOLESTEROL: 179 mg/dL (ref <200)
HDL: 71 mg/dL (ref >50)
LDL CHOLESTEROL (CALC): 90 mg/dL
Non-HDL Cholesterol (Calc): 108 mg/dL (calc) (ref <130)
TRIGLYCERIDES: 85 mg/dL (ref <150)

## 2016-12-12 MED ORDER — ESCITALOPRAM OXALATE 10 MG PO TABS: 10.0000 mg | ORAL_TABLET | Freq: Every day | ORAL | 2 refills | Status: DC

## 2016-12-12 MED ORDER — METFORMIN HCL 500 MG PO TABS: 500.0000 mg | ORAL_TABLET | Freq: Every day | ORAL | 3 refills | Status: DC

## 2016-12-12 MED ORDER — ESCITALOPRAM OXALATE 10 MG PO TABS: 20.0000 mg | ORAL_TABLET | Freq: Every day | ORAL | 0 refills | Status: DC

## 2016-12-12 MED ORDER — LISINOPRIL 10 MG PO TABS: 10.0000 mg | ORAL_TABLET | Freq: Every day | ORAL | 3 refills | Status: DC

## 2016-12-12 MED FILL — ?METFORMIN HCL 500MG TABLET: 500 | 30 days supply | Qty: 30 | Fill #0

## 2016-12-12 MED FILL — ESCITALOPRAM 10 MG TABLET: 10 | 30 days supply | Qty: 30 | Fill #0

## 2016-12-12 NOTE — Telephone Encounter (Signed)
Please contact patient to advise her to please sign medical release form in order for me to obtain her medical records from [facility] in [place].   Carroll Sage. Kenton Kingfisher, MSN, FNP-C [facility]  [address] [place], [place] [address] [phone]

## 2016-12-12 NOTE — Patient Instructions (Signed)
Major Depressive Disorder, Adult Major depressive disorder (MDD) is a mental health condition. MDD often makes you feel sad, hopeless, or helpless. MDD can also cause symptoms in your body. MDD can affect your:  Work.  School.  Relationships.  Other normal activities.  MDD can range from mild to very bad. It may occur once (single episode MDD). It can also occur many times (recurrent MDD). The main symptoms of MDD often include:  Feeling sad, depressed, or irritable most of the time.  Loss of interest.  MDD symptoms also include:  Sleeping too much or too little.  Eating too much or too little.  A change in your weight.  Feeling tired (fatigue) or having low energy.  Feeling worthless.  Feeling guilty.  Trouble making decisions.  Trouble thinking clearly.  Thoughts of suicide or harming others.  Feeling weak.  Feeling agitated.  Keeping yourself from being around other people (isolation).  Follow these instructions at home: Activity  Do these things as told by your doctor: ? Go back to your normal activities. ? Exercise regularly. ? Spend time outdoors. Alcohol  Talk with your doctor about how alcohol can affect your antidepressant medicines.  Do not drink alcohol. Or, limit how much alcohol you drink. ? This means no more than 1 drink a day for nonpregnant women and 2 drinks a day for men. One drink equals one of these:  12 oz of beer.  5 oz of wine.  1 oz of hard liquor. General instructions  Take over-the-counter and prescription medicines only as told by your doctor.  Eat a healthy diet.  Get plenty of sleep.  Find activities that you enjoy. Make time to do them.  Think about joining a support group. Your doctor may be able to suggest a group for you.  Keep all follow-up visits as told by your doctor. This is important. Where to find more information:  [employer] on Mental Illness: ? www.nami.[place]: ? https://carter.com/  National Suicide Prevention Lifeline: ? (959) 675-6535. This is free, 24-hour help. Contact a doctor if:  Your symptoms get worse.  You have new symptoms. Get help right away if:  You self-harm.  You see, hear, taste, smell, or feel things that are not present (hallucinate). If you ever feel like you may hurt yourself or others, or have thoughts about taking your own life, get help right away. You can go to your nearest emergency department or call:  Your local emergency services (911 in the [place]  A suicide crisis helpline, such as the National Suicide Prevention Lifeline: ? 979-710-2447. This is open 24 hours a day.  This information is not intended to replace advice given to you by your health care provider. Make sure you discuss any questions you have with your health care provider. Document Released: 02/02/2015 Document Revised: 11/08/2015 Document Reviewed: 11/08/2015 Elsevier Interactive Patient Education  2017 [place] will be notified of any abnormal lab results.  -I am starting you on Lexpro 10 mg once daily for depression. Return in 3 weeks for depression follow-up to ensure medication effectiveness.  -You A1C is at goal today 6.0.  -Continue to incorporate physical activity into lifestyle to improve, mood and cardiovascular outcomes.   Evidence supports cardiovascular outcomes improve in those who follow the mediterranean and or DASH diet. The Mediterranean diet emphasizes: Eating primarily plant-based foods, such as fruits and vegetables, whole grains, legumes and nuts. Replacing butter with healthy fats such  as olive oil and canola oil. Using herbs and spices instead of salt to flavor foods The DASH diet eating plan is a diet rich in fruits, vegetables, low fat or nonfat dairy. It also includes mostly whole grains; lean meats, fish and poultry; nuts and beans. It is high fiber and low to moderate in fat. It is a plan  that follows US guidelines for sodium content, along with vitamins and minerals.

## 2016-12-12 NOTE — Progress Notes (Signed)
Patient ID: Vickie Marshall, female    DOB: 1966/02/21, 51 y.o.   MRN: [id number]  PCP: Scot Jun, FNP  Chief Complaint  Patient presents with  . Urinary Frequency  . Vaginal Itching  . Vaginal Pain    Subjective:  HPI Vickie Marshall is a 51 y.o. female presents to establish care. She also has complaints of urinary frequency, vaginal itching and pain. Medical history significant for diabetes, hypertension, and fibroids. Ema doesn't routinely check her blood sugar. She is currently prescribed metformin 500 mg daily. Last A1C unknown. Reports no routine exercise. She routinely monitors her blood pressure and readings which she notes are often low.Reports compliance with lisinopril and denies any associated medication side effects. Denies headaches, dizziness, although endorses chronic chest pain which comes and goes. Reports chest pain often occurs while driving. She is uncertain if chest pain is related to anxiety, although she endorses anxiety and depression due to loss of employment and recently being displaced from her home. She is residing with family and has significant financial stressors which will not allow her to obtain a place of her own. She doesn't like her current job. She reports crying spells and difficulty concentrating. Reports previous diagnosis of depression many years previously. Inette complains of urinary frequency, vaginal burning and itching has been persistent for 6 months. No recent sexual contacts withinthe last 6 months. Reports yellowish vaginal discharge without odor. GYN history significant for fibroids. She recently participated in a fibroid study with [facility] in which she received a full work-up although was disqualified from the study as her bleeding was not irregular enough. She is request a referral to gynecologist for further work-up.  Social History   Social History  . Marital status: Single    Spouse name: N/A  . Number of children: N/A  .  Years of education: N/A   Occupational History  . Not on file.   Social History Main Topics  . Smoking status: Never Smoker  . Smokeless tobacco: Never Used  . Alcohol use No  . Drug use: No  . Sexual activity: Not on file   Other Topics Concern  . Not on file   Social History Narrative  . No narrative on file    Family History  Problem Relation Age of Onset  . Diabetes Mother   . Arthritis Mother   . Heart disease Mother   . Cancer Mother   . Hyperlipidemia Mother   . COPD Father    Review of Systems See HPI   There are no active problems to display for this patient.   Allergies  Allergen Reactions  . Asparagus Nausea Only  . Codeine Nausea Only and Other (See Comments)    dizziness  . Eggs Or Egg-Derived Products Nausea Only    Prior to Admission medications   Medication Sig Start Date End Date Taking? Authorizing Provider  cetirizine (ZYRTEC) 10 MG tablet Take 10 mg by mouth daily.   Yes [provider]  ibuprofen (ADVIL,MOTRIN) 800 MG tablet Take 1 tablet (800 mg total) by mouth 3 (three) times daily. 10/28/16  Yes Mesner, Corene Cornea, MD  lisinopril (PRINIVIL,ZESTRIL) 10 MG tablet Take 10 mg by mouth daily.   Yes [provider]  metFORMIN (GLUCOPHAGE) 500 MG tablet Take 500 mg by mouth daily with breakfast.   Yes [provider]  Multiple Vitamin (MULTIVITAMIN) tablet Take 1 tablet by mouth daily.   Yes [provider]  ferrous sulfate 325 (65 FE)  MG tablet Take 325 mg by mouth daily with breakfast.    [provider]  HYDROcodone-acetaminophen (NORCO/VICODIN) 5-325 MG tablet Take 1 tablet by mouth every 6 (six) hours as needed for severe pain. Patient not taking: Reported on 12/12/2016 10/28/16   Mesner, Corene Cornea, MD    Past Medical, Surgical Family and Social History reviewed and updated.    Objective:   Today's Vitals   12/12/16 0820  BP: 135/75  Pulse: 90  Resp: 16  Temp: 98.1 F (36.7 C)  TempSrc: Oral   SpO2: 100%  Weight: 186 lb (84.4 kg)  Height: 5' (1.524 m)    Wt Readings from Last 3 Encounters:  12/12/16 186 lb (84.4 kg)   Physical Exam  Constitutional: She is oriented to person, place, and time. She appears well-developed and well-nourished.  HENT:  Head: Normocephalic and atraumatic.  Eyes: Pupils are equal, round, and reactive to light. Conjunctivae and EOM are normal.  Neck: Normal range of motion. Neck supple.  Cardiovascular: Normal rate, regular rhythm, normal heart sounds and intact distal pulses.   Pulmonary/Chest: Effort normal and breath sounds normal.  Genitourinary:  Genitourinary Comments: Self specimen collected   Musculoskeletal: Normal range of motion.  Neurological: She is alert and oriented to person, place, and time.  Skin: Skin is warm and dry.  Psychiatric: She has a normal mood and affect. Her behavior is normal. Judgment and thought content normal.   Assessment & Plan:  1. Type 2 diabetes mellitus without complication, without long-term current use of insulin ([place]), A1C 6.0 Controlled. Continue metformin 500 mg daily with meal. 2. Essential hypertension, Controlled, continue Lisinopril 10 mg daily. 3. Reactive depression, escitalopram 20 mg once daily. 4. Fibroids, referral to gynecology 5. Vaginal itching, Cervicovaginal ancillary only 6. Irregular bleeding, referral to gynecology 7. Screening, lipid- Lipid panel  RTC: 1 month depression follow-up.  Carroll Sage. Kenton Kingfisher, MSN, FNP-C [facility]  [address] [place], [place] [address] [phone]

## 2016-12-12 NOTE — Telephone Encounter (Signed)
Patient notified and will fill out form and send it back

## 2016-12-13 LAB — CERVICOVAGINAL ANCILLARY ONLY
BACTERIAL VAGINITIS: POSITIVE — AB
CANDIDA VAGINITIS: NEGATIVE
CHLAMYDIA, DNA PROBE: NEGATIVE
NEISSERIA GONORRHEA: NEGATIVE
TRICH (WINDOWPATH): NEGATIVE

## 2016-12-16 MED ORDER — METRONIDAZOLE 500 MG PO TABS: 500.0000 mg | ORAL_TABLET | Freq: Two times a day (BID) | ORAL | 0 refills | Status: DC

## 2016-12-16 MED FILL — metroNIDAZOLE 500 MG TABS: 500 | 7 days supply | Qty: 14 | Fill #0

## 2016-12-17 ENCOUNTER — Telehealth: Payer: Self-pay | Admitting: Family Medicine

## 2016-12-17 NOTE — Telephone Encounter (Signed)
Please process referral to gynecological for fibroids and irregular bleeding.

## 2016-12-19 NOTE — Telephone Encounter (Signed)
Referral faxed per provider

## 2016-12-22 ENCOUNTER — Telehealth: Payer: Self-pay | Admitting: Family Medicine

## 2016-12-22 NOTE — Telephone Encounter (Signed)
Follow-up on status of referral and advise if patient has been scheduled.

## 2016-12-26 NOTE — Telephone Encounter (Signed)
[facility] called patient to schedule and she said that she will call them back and schedule appointment. Spoke with patient this morning and gave her the number to [facility] and she will call me back and let me know once she schedule

## 2017-01-09 ENCOUNTER — Ambulatory Visit: Payer: Self-pay | Admitting: Family Medicine

## 2017-03-03 ENCOUNTER — Telehealth: Payer: Self-pay

## 2017-03-03 NOTE — Telephone Encounter (Signed)
Left a vm for patient to callback 

## 2017-03-03 NOTE — Telephone Encounter (Signed)
Patient given information for [facility]

## 2017-03-28 MED FILL — LISINOPRIL 10 MG TABS: 10 | 30 days supply | Qty: 30 | Fill #0

## 2017-04-21 ENCOUNTER — Emergency Department (HOSPITAL_COMMUNITY)
Admission: EM | Admit: 2017-04-21 | Discharge: 2017-04-21 | Disposition: A | Discharge status: Home / Self Care | Payer: Medicaid Other | Attending: Emergency Medicine | Admitting: Emergency Medicine

## 2017-04-21 ENCOUNTER — Encounter (HOSPITAL_COMMUNITY): Payer: Self-pay | Admitting: *Deleted

## 2017-04-21 ENCOUNTER — Other Ambulatory Visit: Payer: Self-pay

## 2017-04-21 DIAGNOSIS — E119 Type 2 diabetes mellitus without complications: Secondary | ICD-10-CM | POA: Insufficient documentation

## 2017-04-21 DIAGNOSIS — I10 Essential (primary) hypertension: Secondary | ICD-10-CM | POA: Insufficient documentation

## 2017-04-21 DIAGNOSIS — Z79899 Other long term (current) drug therapy: Secondary | ICD-10-CM | POA: Insufficient documentation

## 2017-04-21 DIAGNOSIS — R1084 Generalized abdominal pain: Secondary | ICD-10-CM

## 2017-04-21 DIAGNOSIS — Z7984 Long term (current) use of oral hypoglycemic drugs: Secondary | ICD-10-CM | POA: Insufficient documentation

## 2017-04-21 DIAGNOSIS — N644 Mastodynia: Secondary | ICD-10-CM | POA: Insufficient documentation

## 2017-04-21 DIAGNOSIS — K6289 Other specified diseases of anus and rectum: Secondary | ICD-10-CM | POA: Insufficient documentation

## 2017-04-21 LAB — COMPREHENSIVE METABOLIC PANEL
ALBUMIN: 4 g/dL (ref 3.5–5.0)
ALT: 15 U/L (ref 14–54)
AST: 20 U/L (ref 15–41)
Alkaline Phosphatase: 61 U/L (ref 38–126)
Anion gap: 12 (ref 5–15)
BUN: 11 mg/dL (ref 6–20)
CHLORIDE: 102 mmol/L (ref 101–111)
CO2: 23 mmol/L (ref 22–32)
CREATININE: 0.86 mg/dL (ref 0.44–1.00)
Calcium: 9.5 mg/dL (ref 8.9–10.3)
GFR calc Af Amer: >60 mL/min (ref >60)
GLUCOSE: 91 mg/dL (ref 65–99)
POTASSIUM: 3.5 mmol/L (ref 3.5–5.1)
Sodium: 137 mmol/L (ref 135–145)
Total Bilirubin: 1.1 mg/dL (ref 0.3–1.2)
Total Protein: 7.2 g/dL (ref 6.5–8.1)

## 2017-04-21 LAB — I-STAT BETA HCG BLOOD, ED (MC, WL, AP ONLY): I-stat hCG, quantitative: <5 m[IU]/mL (ref <5)

## 2017-04-21 LAB — URINALYSIS, ROUTINE W REFLEX MICROSCOPIC
Bilirubin Urine: NEGATIVE
GLUCOSE, UA: NEGATIVE mg/dL
HGB URINE DIPSTICK: NEGATIVE
Ketones, ur: NEGATIVE mg/dL
LEUKOCYTES UA: NEGATIVE
Nitrite: NEGATIVE
PH: 6 (ref 5.0–8.0)
Protein, ur: NEGATIVE mg/dL
SPECIFIC GRAVITY, URINE: 1.008 (ref 1.005–1.030)

## 2017-04-21 LAB — LIPASE, BLOOD: Lipase: 24 U/L (ref 11–51)

## 2017-04-21 LAB — CBC
HEMATOCRIT: 39.7 % (ref 36.0–46.0)
Hemoglobin: 13.1 g/dL (ref 12.0–15.0)
MCH: 29.4 pg (ref 26.0–34.0)
MCHC: 33 g/dL (ref 30.0–36.0)
MCV: 89.2 fL (ref 78.0–100.0)
PLATELETS: 263 10*3/uL (ref 150–400)
RBC: 4.45 MIL/uL (ref 3.87–5.11)
RDW: 13.8 % (ref 11.5–15.5)
WBC: 5 10*3/uL (ref 4.0–10.5)

## 2017-04-21 NOTE — ED Triage Notes (Signed)
Pt in reports being diagnosed with ovarian cancer in August, pt does not have an oncologist, pt wants to see how far her cancer has spread, pt c/o L sided breast pain, pt c/o bil pelvic pain, pt denies SOB, denies v/d, pt c/o intermittent nausea, A&O x4

## 2017-04-21 NOTE — ED Notes (Signed)
Social work in room at this time.

## 2017-04-21 NOTE — Discharge Instructions (Signed)
Please keep your appointment with Molli Barrows on 2/19 at 08:45 AM regarding your to the emergency department today.  The results of your urine are pending.  If anything is abnormal, I will call and update you.  Take 1000 mg of Tylenol or 600 mg of ibuprofen with food every 8 hours as needed for pain.  Abdominal (belly) pain can be caused by many things. Your caregiver performed an examination and possibly ordered blood/urine tests and imaging (CT scan, x-rays, ultrasound). Many cases can be observed and treated at home after initial evaluation in the emergency department. Even though you are being discharged home, abdominal pain can be unpredictable. Therefore, you need a repeated exam if your pain does not resolve, returns, or worsens. Most patients with abdominal pain don't have to be admitted to the hospital or have surgery, but serious problems like appendicitis and gallbladder attacks can start out as nonspecific pain. Many abdominal conditions cannot be diagnosed in one visit, so follow-up evaluations are very important. SEEK IMMEDIATE MEDICAL ATTENTION IF: The pain does not go away or becomes severe.  A temperature above 101 develops.  Repeated vomiting occurs (multiple episodes).  The pain becomes localized to portions of the abdomen. The right side could possibly be appendicitis. In an adult, the left lower portion of the abdomen could be colitis or diverticulitis.  Blood is being passed in stools or vomit (bright red or black tarry stools).  Return also if you develop chest pain, difficulty breathing, dizziness or fainting, or become confused, poorly responsive, or inconsolable (young children).  If you develop new or worsening symptoms, including bleeding from the rectum, black tarry stools, high fever that does not improve with Tylenol,  persistent vomiting, or other concerning symptoms, please return to the emergency department for re-evaluation.

## 2017-04-21 NOTE — Progress Notes (Signed)
CSW informed by RN that pt came to ED to get further evaluation on ovarian cancer. CSW spoke with [facility] and confirmed that pt is already set up with Molli Barrows at the [facility]. RN informed CSW that RN has scheduled another appointment for pt with this PCP for Tuesday at 8:45am. At this time there are no further CSW needs. CSW signing off.     Virgie Dad Jontae Sonier, MSW, [facility] Clinical Social Worker [phone]

## 2017-04-21 NOTE — Discharge Planning (Signed)
Neliah Cuyler J. Clydene Laming, RN, BSN, [place]  [facility] set up appointment with Patient Vickie Marshall on 2/19 @ 0845.  Spoke with pt RN and informed him that appointment information will be printed on the AVS at discharge.  No further EDCM needs identified at this time.

## 2017-04-21 NOTE — ED Provider Notes (Signed)
[place] EMERGENCY DEPARTMENT Provider Note   CSN: [id number] Arrival date & time: 04/21/17  9211     History   Chief Complaint Chief Complaint  Patient presents with  . Breast Pain  . Pelvic Pain    HPI Vickie Marshall is a 52 y.o. female with a h/o of DM, anxiety, uterine fibroids, and HTN presenting with gradually worsening left breast pain, rectal pain, and generalized abdominal pain over the last week.  She states that she was diagnosed with ovarian cancer in August by "a trusted medical professional who perfromed a spiritual healing ceremony". She declined to give the individual's name. The person is from [place].  She states "I'm concerned my cancer has spread. I don't have an oncologist, and I can't see my OBGYN anymore she doesn't take my insurance."  She endorses associated bloating and belching. No aggravating or alleviating factors.   She denies fever, chills,dyspnea, N/V/D, hematochezia, melena, hematemesis, rash, dysuria, hematuria, vaginal pain or bleeding. No redness, warmth or swelling to the left breast. She has not been sexually active in many years.   She currently lives with her sister and endorses increased stressed about her finances and her health.   The history is provided by the patient. No language interpreter was used.    Past Medical History:  Diagnosis Date  . Anxiety   . Diabetes mellitus without complication ([place])   . Hypertension     There are no active problems to display for this patient.   Past Surgical History:  Procedure Laterality Date  . CHOLECYSTECTOMY      OB History    No data available       Home Medications    Prior to Admission medications   Medication Sig Start Date End Date Taking? Authorizing Provider  cetirizine (ZYRTEC) 10 MG tablet Take 10 mg by mouth daily.    [provider]  escitalopram (LEXAPRO) 10 MG tablet Take 2 tablets (20 mg total) by mouth at bedtime. 12/12/16    Scot Jun, FNP  ferrous sulfate 325 (65 FE) MG tablet Take 325 mg by mouth daily with breakfast.    [provider]  HYDROcodone-acetaminophen (NORCO/VICODIN) 5-325 MG tablet Take 1 tablet by mouth every 6 (six) hours as needed for severe pain. Patient not taking: Reported on 12/12/2016 10/28/16   Mesner, Corene Cornea, MD  ibuprofen (ADVIL,MOTRIN) 800 MG tablet Take 1 tablet (800 mg total) by mouth 3 (three) times daily. 10/28/16   Mesner, Corene Cornea, MD  lisinopril (PRINIVIL,ZESTRIL) 10 MG tablet Take 1 tablet (10 mg total) by mouth daily. 12/12/16   Scot Jun, FNP  metFORMIN (GLUCOPHAGE) 500 MG tablet Take 1 tablet (500 mg total) by mouth daily with breakfast. 12/12/16   Scot Jun, FNP  metroNIDAZOLE (FLAGYL) 500 MG tablet Take 1 tablet (500 mg total) by mouth 2 (two) times daily. 12/16/16   Scot Jun, FNP  Multiple Vitamin (MULTIVITAMIN) tablet Take 1 tablet by mouth daily.    [provider]    Family History Family History  Problem Relation Age of Onset  . Diabetes Mother   . Arthritis Mother   . Heart disease Mother   . Cancer Mother   . Hyperlipidemia Mother   . COPD Father     Social History Social History   Tobacco Use  . Smoking status: Never Smoker  . Smokeless tobacco: Never Used  Substance Use Topics  . Alcohol use: No  . Drug use: No  Allergies   Asparagus; Codeine; and Eggs or egg-derived products   Review of Systems Review of Systems  Constitutional: Negative for activity change, chills and fever.  Eyes: Negative for visual disturbance.  Respiratory: Negative for shortness of breath.   Cardiovascular: Positive for chest pain (left breast).  Gastrointestinal: Positive for abdominal pain and rectal pain. Negative for diarrhea, nausea and vomiting.  Genitourinary: Negative for dysuria.  Musculoskeletal: Negative for back pain.  Skin: Negative for color change, rash and wound.  Allergic/Immunologic: Negative for  immunocompromised state.  Neurological: Negative for headaches.  Psychiatric/Behavioral: Negative for confusion.     Physical Exam Updated Vital Signs BP (!) 114/58 (BP Location: Right Arm)   Pulse 89   Temp 98.3 F (36.8 C) (Oral)   Resp 18   Ht 5' (1.524 m)   Wt 81.6 kg (180 lb)   LMP 03/22/2017 (Approximate)   SpO2 100%   BMI 35.15 kg/m   Physical Exam  Constitutional: No distress.  HENT:  Head: Normocephalic.  Eyes: Conjunctivae are normal.  Neck: Neck supple.  Cardiovascular: Normal rate, regular rhythm, normal heart sounds and intact distal pulses. Exam reveals no gallop and no friction rub.  No murmur heard. Pulmonary/Chest: Effort normal and breath sounds normal. No stridor. No respiratory distress. She has no wheezes. She has no rales. She exhibits no tenderness.  Abdominal: Soft. Bowel sounds are normal. She exhibits no distension and no mass. There is tenderness. There is no rebound and no guarding. No hernia.  Mild epigastric TTP. No peritoneal signs. No flank TTP or CVA bilaterally.   Neurological: She is alert.  Skin: Skin is warm. No rash noted.  Psychiatric: Her behavior is normal.  Anxious appearing  Nursing note and vitals reviewed.    ED Treatments / Results  Labs (all labs ordered are listed, but only abnormal results are displayed) Labs Reviewed  CBC  COMPREHENSIVE METABOLIC PANEL  LIPASE, BLOOD  URINALYSIS, ROUTINE W REFLEX MICROSCOPIC  I-STAT BETA HCG BLOOD, ED ([facility], [facility], [facility] ONLY)    EKG  EKG Interpretation None       Radiology No results found.  Procedures Procedures (including critical care time)  Medications Ordered in ED Medications - No data to display   Initial Impression / Assessment and Plan / ED Course  I have reviewed the triage vital signs and the nursing notes.  Pertinent labs & imaging results that were available during my care of the patient were reviewed by me and considered in my medical decision making (see  chart for details).     52 y.o. female with a h/o of HTN, uterine fibroids, DM, and anxiety presented with concerns for metastasized cancer after she was diagnosed with ovarian CA in 08/18 by during "a spiritual ritual". VSS. NAD. On physical exam, abdominal exam is unremarkable. She declines rectal exam. She declines an IV. Labs are reassuring without any abnormalities. UA is negative. Discussed emergent vs OP workup in detail. Social work set the patient up with an OP f/u appointment with Molli Barrows as she is also due for a colonoscopy. Shared decision making conversation, and the patient would like to have any further workup performed on an OP basis. Exam was more concerning for gastritis, but given her concerns and no formal workup for ovarian CA will have the patient f/u with her PCP. She declines a GI cocktail at this time. She is safe for d/c to home at this time.    Final Clinical Impressions(s) / ED Diagnoses  Final diagnoses:  Generalized abdominal pain  Breast pain, left  Rectal pain    ED Discharge Orders    None       Joanne Gavel, PA-C 04/22/17 1237    Duffy Bruce, MD 04/22/17 [phone]

## 2017-04-25 ENCOUNTER — Emergency Department (HOSPITAL_COMMUNITY)
Admission: EM | Admit: 2017-04-25 | Discharge: 2017-04-25 | Disposition: A | Discharge status: Home / Self Care | Payer: Medicaid Other | Attending: Emergency Medicine | Admitting: Emergency Medicine

## 2017-04-25 ENCOUNTER — Encounter (HOSPITAL_COMMUNITY): Payer: Self-pay | Admitting: Nurse Practitioner

## 2017-04-25 ENCOUNTER — Other Ambulatory Visit: Payer: Self-pay

## 2017-04-25 ENCOUNTER — Ambulatory Visit (INDEPENDENT_AMBULATORY_CARE_PROVIDER_SITE_OTHER): Payer: Medicaid Other | Admitting: Family Medicine

## 2017-04-25 ENCOUNTER — Encounter: Payer: Self-pay | Admitting: Family Medicine

## 2017-04-25 VITALS — BP 110/66 | HR 80 | Temp 98.0°F | Resp 16 | Ht 60.0 in | Wt 173.0 lb

## 2017-04-25 DIAGNOSIS — F4321 Adjustment disorder with depressed mood: Secondary | ICD-10-CM | POA: Insufficient documentation

## 2017-04-25 DIAGNOSIS — I10 Essential (primary) hypertension: Secondary | ICD-10-CM | POA: Insufficient documentation

## 2017-04-25 DIAGNOSIS — F321 Major depressive disorder, single episode, moderate: Secondary | ICD-10-CM

## 2017-04-25 DIAGNOSIS — E119 Type 2 diabetes mellitus without complications: Secondary | ICD-10-CM | POA: Insufficient documentation

## 2017-04-25 DIAGNOSIS — R45851 Suicidal ideations: Secondary | ICD-10-CM | POA: Insufficient documentation

## 2017-04-25 DIAGNOSIS — Z79899 Other long term (current) drug therapy: Secondary | ICD-10-CM | POA: Insufficient documentation

## 2017-04-25 DIAGNOSIS — F419 Anxiety disorder, unspecified: Secondary | ICD-10-CM | POA: Insufficient documentation

## 2017-04-25 DIAGNOSIS — F332 Major depressive disorder, recurrent severe without psychotic features: Secondary | ICD-10-CM

## 2017-04-25 DIAGNOSIS — R1013 Epigastric pain: Secondary | ICD-10-CM | POA: Insufficient documentation

## 2017-04-25 DIAGNOSIS — Z7984 Long term (current) use of oral hypoglycemic drugs: Secondary | ICD-10-CM | POA: Insufficient documentation

## 2017-04-25 DIAGNOSIS — Z046 Encounter for general psychiatric examination, requested by authority: Secondary | ICD-10-CM | POA: Insufficient documentation

## 2017-04-25 LAB — RAPID URINE DRUG SCREEN, HOSP PERFORMED
Amphetamines: NOT DETECTED
Barbiturates: NOT DETECTED
Benzodiazepines: NOT DETECTED
Cocaine: NOT DETECTED
Opiates: NOT DETECTED
Tetrahydrocannabinol: NOT DETECTED

## 2017-04-25 LAB — COMPREHENSIVE METABOLIC PANEL WITH GFR
ALT: 15 U/L (ref 14–54)
AST: 20 U/L (ref 15–41)
Albumin: 4.4 g/dL (ref 3.5–5.0)
Alkaline Phosphatase: 64 U/L (ref 38–126)
Anion gap: 10 (ref 5–15)
BUN: 15 mg/dL (ref 6–20)
CO2: 27 mmol/L (ref 22–32)
Calcium: 9.4 mg/dL (ref 8.9–10.3)
Chloride: 101 mmol/L (ref 101–111)
Creatinine, Ser: 0.85 mg/dL (ref 0.44–1.00)
GFR calc Af Amer: >60 mL/min
GFR calc non Af Amer: >60 mL/min
Glucose, Bld: 105 mg/dL — ABNORMAL HIGH (ref 65–99)
Potassium: 3.6 mmol/L (ref 3.5–5.1)
Sodium: 138 mmol/L (ref 135–145)
Total Bilirubin: 0.5 mg/dL (ref 0.3–1.2)
Total Protein: 7.7 g/dL (ref 6.5–8.1)

## 2017-04-25 LAB — CBC WITH DIFFERENTIAL/PLATELET
BASOS ABS: 0 10*3/uL (ref 0.0–0.1)
Basophils Relative: 1 %
Eosinophils Absolute: 0.2 10*3/uL (ref 0.0–0.7)
Eosinophils Relative: 5 %
HCT: 39.3 % (ref 36.0–46.0)
HEMOGLOBIN: 13.3 g/dL (ref 12.0–15.0)
LYMPHS PCT: 39 %
Lymphs Abs: 1.9 10*3/uL (ref 0.7–4.0)
MCH: 29.9 pg (ref 26.0–34.0)
MCHC: 33.8 g/dL (ref 30.0–36.0)
MCV: 88.3 fL (ref 78.0–100.0)
Monocytes Absolute: 0.5 10*3/uL (ref 0.1–1.0)
Monocytes Relative: 10 %
NEUTROS PCT: 45 %
Neutro Abs: 2.1 10*3/uL (ref 1.7–7.7)
Platelets: 279 10*3/uL (ref 150–400)
RBC: 4.45 MIL/uL (ref 3.87–5.11)
RDW: 13.4 % (ref 11.5–15.5)
WBC: 4.7 10*3/uL (ref 4.0–10.5)

## 2017-04-25 LAB — POCT URINALYSIS DIP (DEVICE)
BILIRUBIN URINE: NEGATIVE
Glucose, UA: NEGATIVE mg/dL
KETONES UR: NEGATIVE mg/dL
LEUKOCYTES UA: NEGATIVE
Nitrite: NEGATIVE
PH: 5.5 (ref 5.0–8.0)
Protein, ur: NEGATIVE mg/dL
Specific Gravity, Urine: 1.025 (ref 1.005–1.030)
Urobilinogen, UA: 0.2 mg/dL (ref 0.0–1.0)

## 2017-04-25 LAB — LIPASE, BLOOD: Lipase: 29 U/L (ref 11–51)

## 2017-04-25 MED ORDER — FAMOTIDINE 20 MG PO TABS
40.0000 mg | ORAL_TABLET | Freq: Once | ORAL | Status: AC
Start: 2017-04-25 — End: 2017-04-25
  Administered 2017-04-25: 40 mg via ORAL
  Filled 2017-04-25: qty 2

## 2017-04-25 MED ORDER — GI COCKTAIL ~~LOC~~
30.0000 mL | Freq: Once | ORAL | Status: AC
  Administered 2017-04-25: 30 mL via ORAL
  Filled 2017-04-25: qty 30

## 2017-04-25 NOTE — ED Triage Notes (Signed)
Patient was an DR apt and stated she was SI and was looking up on the computer about ways to kill herself. Patient was transferred to ED by police.

## 2017-04-25 NOTE — Progress Notes (Signed)
Patient ID: Vickie Marshall, female    DOB: 1965/12/01, 52 y.o.   MRN: [id number]  PCP: Scot Jun, FNP  Chief Complaint  Patient presents with  . Hospitalization Follow-up    Subjective:  HPI Vickie Marshall is a 52 y.o. female with a history of Diabetes, Hypertension, Major Depression Disorder, presents for ED follow-up. Upon arrival, to the clinic today, patient reports that "I am sick of life", "I am ready for the rapture",  And "I want life to end". Currently living with her sister and her sister is wanting to put her out. She has been unemployed for over a year and reports his frustration with overall life and the fact that her finances have all but dwindled since she has been out of work.  She reports that her car is about to be repossessed and she is actually having it at present to keep it from being taken.  She reports she does not know what to do and where to turn " she wanted to end".  She reports besides her sister she has no other family willing to help and at present her sister wants her to move out and therefore she will be homeless.  She denies any prior suicidal attempts however reports that she has been looking on [employer] to find ways to commit suicide. Social History   Socioeconomic History  . Marital status: Single    Spouse name: Not on file  . Number of children: Not on file  . Years of education: Not on file  . Highest education level: Not on file  Social Needs  . Financial resource strain: Not on file  . Food insecurity - worry: Not on file  . Food insecurity - inability: Not on file  . Transportation needs - medical: Not on file  . Transportation needs - non-medical: Not on file  Occupational History  . Not on file  Tobacco Use  . Smoking status: Never Smoker  . Smokeless tobacco: Never Used  Substance and Sexual Activity  . Alcohol use: No  . Drug use: No  . Sexual activity: Not on file  Other Topics Concern  . Not on file  Social History  Narrative  . Not on file    Family History  Problem Relation Age of Onset  . Diabetes Mother   . Arthritis Mother   . Heart disease Mother   . Cancer Mother   . Hyperlipidemia Mother   . COPD Father    Review of Systems See HPI Allergies  Allergen Reactions  . Asparagus Nausea Only  . Codeine Nausea Only and Other (See Comments)    dizziness  . Eggs Or Egg-Derived Products Nausea Only    Prior to Admission medications   Medication Sig Start Date End Date Taking? Authorizing Provider  lisinopril (PRINIVIL,ZESTRIL) 10 MG tablet Take 1 tablet (10 mg total) by mouth daily. 12/12/16  Yes Scot Jun, FNP  metFORMIN (GLUCOPHAGE) 500 MG tablet Take 1 tablet (500 mg total) by mouth daily with breakfast. 12/12/16  Yes Scot Jun, FNP  Multiple Vitamin (MULTIVITAMIN) tablet Take 1 tablet by mouth daily.   Yes [provider]  cetirizine (ZYRTEC) 10 MG tablet Take 10 mg by mouth daily.    [provider]  escitalopram (LEXAPRO) 10 MG tablet Take 2 tablets (20 mg total) by mouth at bedtime. Patient not taking: Reported on 04/25/2017 12/12/16   Scot Jun, FNP  ferrous sulfate 325 (65 FE) MG tablet  Take 325 mg by mouth daily with breakfast.    [provider]  HYDROcodone-acetaminophen (NORCO/VICODIN) 5-325 MG tablet Take 1 tablet by mouth every 6 (six) hours as needed for severe pain. Patient not taking: Reported on 12/12/2016 10/28/16   Mesner, Corene Cornea, MD  ibuprofen (ADVIL,MOTRIN) 800 MG tablet Take 1 tablet (800 mg total) by mouth 3 (three) times daily. Patient not taking: Reported on 04/25/2017 10/28/16   Mesner, Corene Cornea, MD  metroNIDAZOLE (FLAGYL) 500 MG tablet Take 1 tablet (500 mg total) by mouth 2 (two) times daily. Patient not taking: Reported on 04/25/2017 12/16/16   Scot Jun, FNP    Past Medical, Surgical Family and Social History reviewed and updated.    Objective:   Today's Vitals   04/25/17 0905  BP: 110/66  Pulse: 80   Resp: 16  Temp: 98 F (36.7 C)  TempSrc: Oral  SpO2: 100%  Weight: 173 lb (78.5 kg)  Height: 5' (1.524 m)    Wt Readings from Last 3 Encounters:  04/25/17 173 lb (78.5 kg)  04/21/17 180 lb (81.6 kg)  12/12/16 186 lb (84.4 kg)    Physical Exam Deferred   Assessment & Plan:  1. Suicidal ideations 2. Severe episode of recurrent major depressive disorder, without psychotic features [facility]) Patient is actively experiencing suicidal ideations and actively seeking a plan for suicide via the Internet.  [place] was contacted and Wellsite geologist and patient was escorted to [facility].  She was advised to follow-up here in office once evaluation has been completed.  A total of 46minutes spent, greater than 50 % of this time evaluating patient's overall state of mind, safety, intense, reviewing past medical history and past medical incidence of suicidal ideations or attempts, prior treatments, and contacting the [place] police department to have officer summoned to take patient to behavioral health.  Due to safety precautions patient had to remain in view of the provider while the police officers transported themselves to the [place]. Kenton Kingfisher, MSN, FNP-C [facility]  [address] Barbara Cower [place], [place] [address] [phone]

## 2017-04-25 NOTE — ED Provider Notes (Signed)
[facility] DEPT Provider Note   CSN: [id number] Arrival date & time: 04/25/17  1042     History   Chief Complaint Chief Complaint  Patient presents with  . Depression    HPI Vickie Marshall is a 52 y.o. female.  52 yo F with a chief complaint of abdominal pain.  Patient was sent to the ED due to suicidal thoughts that were expressed to her primary care provider earlier today.  The patient states that she lost her job and has lost her insurance and has had worsening of her medical health as well as her spiritual health and now is starting to feel helpless.  She lost her home and is living with her sister she does not feel wants her they are.  She has had sliding ideas of suicidality off and on for the past couple weeks.  She never attempted suicide.  She does not have a discrete plan.  Was recently started on Lexapro and has been taking it regularly.  Her abdominal pain is been going on for the past week or so.  She is not sure what makes it worse.  It is epigastric without radiation.  Described as a sharp pain similar to when she had cholecystitis.  Was significantly worse last night and then has improved somewhat this morning.  She has not tried anything for this at home.  Is not sure if it is associated with food.  Points to her epigastrium as the area of the pain.  Denies dysuria frequency or flank pain.  Denies nausea vomiting or fever.  Denies diarrhea.   The history is provided by the patient.  Illness  This is a new problem. The current episode started more than 1 week ago. The problem occurs constantly. The problem has been gradually worsening. Associated symptoms include abdominal pain. Pertinent negatives include no chest pain, no headaches and no shortness of breath. Nothing aggravates the symptoms. Nothing relieves the symptoms. She has tried nothing for the symptoms. The treatment provided no relief.    Past Medical History:  Diagnosis Date  .  Anxiety   . Diabetes mellitus without complication ([place])   . Hypertension     There are no active problems to display for this patient.   Past Surgical History:  Procedure Laterality Date  . CHOLECYSTECTOMY      OB History    No data available       Home Medications    Prior to Admission medications   Medication Sig Start Date End Date Taking? Authorizing Provider  cetirizine (ZYRTEC) 10 MG tablet Take 10 mg by mouth daily.   Yes [provider]  lisinopril-hydrochlorothiazide (PRINZIDE,ZESTORETIC) 20-25 MG tablet Take 1 tablet by mouth daily.   Yes [provider]  metFORMIN (GLUCOPHAGE) 500 MG tablet Take 1 tablet (500 mg total) by mouth daily with breakfast. Patient taking differently: Take 500 mg by mouth 2 (two) times daily with a meal.  12/12/16  Yes Scot Jun, FNP  Multiple Vitamin (MULTIVITAMIN) tablet Take 1 tablet by mouth daily.   Yes [provider]  escitalopram (LEXAPRO) 10 MG tablet Take 2 tablets (20 mg total) by mouth at bedtime. Patient not taking: Reported on 04/25/2017 12/12/16   Scot Jun, FNP  HYDROcodone-acetaminophen (NORCO/VICODIN) 5-325 MG tablet Take 1 tablet by mouth every 6 (six) hours as needed for severe pain. Patient not taking: Reported on 12/12/2016 10/28/16   Mesner, Corene Cornea, MD  ibuprofen (ADVIL,MOTRIN) 800 MG tablet  Take 1 tablet (800 mg total) by mouth 3 (three) times daily. Patient not taking: Reported on 04/25/2017 10/28/16   Mesner, Corene Cornea, MD  lisinopril (PRINIVIL,ZESTRIL) 10 MG tablet Take 1 tablet (10 mg total) by mouth daily. Patient not taking: Reported on 04/25/2017 12/12/16   Scot Jun, FNP  metroNIDAZOLE (FLAGYL) 500 MG tablet Take 1 tablet (500 mg total) by mouth 2 (two) times daily. Patient not taking: Reported on 04/25/2017 12/16/16   Scot Jun, FNP    Family History Family History  Problem Relation Age of Onset  . Diabetes Mother   . Arthritis Mother   . Heart disease  Mother   . Cancer Mother   . Hyperlipidemia Mother   . COPD Father     Social History Social History   Tobacco Use  . Smoking status: Never Smoker  . Smokeless tobacco: Never Used  Substance Use Topics  . Alcohol use: No  . Drug use: No     Allergies   Asparagus; Codeine; and Eggs or egg-derived products   Review of Systems Review of Systems  Constitutional: Negative for chills and fever.  HENT: Negative for congestion and rhinorrhea.   Eyes: Negative for redness and visual disturbance.  Respiratory: Negative for shortness of breath and wheezing.   Cardiovascular: Negative for chest pain and palpitations.  Gastrointestinal: Positive for abdominal pain. Negative for nausea and vomiting.  Genitourinary: Negative for dysuria and urgency.  Musculoskeletal: Negative for arthralgias and myalgias.  Skin: Negative for pallor and wound.  Neurological: Negative for dizziness and headaches.  Psychiatric/Behavioral: Positive for dysphoric mood and suicidal ideas.     Physical Exam Updated Vital Signs BP 123/76 (BP Location: Right Arm)   Pulse 81   Temp 97.8 F (36.6 C) (Oral)   Resp 16   Ht 5' (1.524 m)   Wt 78.5 kg (173 lb)   SpO2 100%   BMI 33.79 kg/m   Physical Exam  Constitutional: She is oriented to person, place, and time. She appears well-developed and well-nourished. No distress.  HENT:  Head: Normocephalic and atraumatic.  Eyes: EOM are normal. Pupils are equal, round, and reactive to light.  Neck: Normal range of motion. Neck supple.  Cardiovascular: Normal rate and regular rhythm. Exam reveals no gallop and no friction rub.  No murmur heard. Pulmonary/Chest: Effort normal. She has no wheezes. She has no rales.  Abdominal: Soft. She exhibits no distension and no mass. There is no tenderness. There is no guarding.  Musculoskeletal: She exhibits no edema or tenderness.  Neurological: She is alert and oriented to person, place, and time.  Skin: Skin is warm  and dry. She is not diaphoretic.  Psychiatric: She has a normal mood and affect. Her behavior is normal.  Nursing note and vitals reviewed.    ED Treatments / Results  Labs (all labs ordered are listed, but only abnormal results are displayed) Labs Reviewed  COMPREHENSIVE METABOLIC PANEL - Abnormal; Notable for the following components:      Result Value   Glucose, Bld 105 (*)    All other components within normal limits  CBC WITH DIFFERENTIAL/PLATELET  LIPASE, BLOOD  RAPID URINE DRUG SCREEN, HOSP PERFORMED    EKG  EKG Interpretation None       Radiology No results found.  Procedures Procedures (including critical care time)  Medications Ordered in ED Medications  famotidine (PEPCID) tablet 40 mg (not administered)  gi cocktail (Maalox,Lidocaine,Donnatal) (30 mLs Oral Given 04/25/17 1311)     Initial  Impression / Assessment and Plan / ED Course  I have reviewed the triage vital signs and the nursing notes.  Pertinent labs & imaging results that were available during my care of the patient were reviewed by me and considered in my medical decision making (see chart for details).     52 yo F with a chief complaint of epigastric abdominal pain.  This been going on for about a week.  She has very mild pain to the epigastrium.  She was seen here about a week ago and had a laboratory evaluation that was unremarkable.  I will repeat labs here though I feel that her symptoms most likely are reflux disease.  At this point I think she is medically clear to be evaluated by TTS.  TTS feels safe for D/c.  Abdominal labs are reassuring.  Patient had significant improvement with a GI cocktail.  Discharge home.  4:22 PM:  I have discussed the diagnosis/risks/treatment options with the patient and believe the pt to be eligible for discharge home to follow-up with PCP, GI. We also discussed returning to the ED immediately if new or worsening sx occur. We discussed the sx which are most  concerning (e.g., sudden worsening pain, fever, inability to tolerate by mouth) that necessitate immediate return. Medications administered to the patient during their visit and any new prescriptions provided to the patient are listed below.  Medications given during this visit Medications  famotidine (PEPCID) tablet 40 mg (not administered)  gi cocktail (Maalox,Lidocaine,Donnatal) (30 mLs Oral Given 04/25/17 1311)     The patient appears reasonably screen and/or stabilized for discharge and I doubt any other medical condition or other [facility] requiring further screening, evaluation, or treatment in the ED at this time prior to discharge.    Final Clinical Impressions(s) / ED Diagnoses   Final diagnoses:  Epigastric abdominal pain  Current moderate episode of major depressive disorder, unspecified whether recurrent [facility])    ED Discharge Orders    None       Deno Etienne, DO 04/25/17 1622

## 2017-04-25 NOTE — BH Assessment (Signed)
Assessment Note  Vickie Marshall is a 52 y.o. female, in [place] due to being brought in by LE after her PCP called them about concerns for SI. Pt reports that the PA asked her how she was doing and she shared that she was not having a good day or week. She also shared with PA that she was not afraid to die, she was ready for the world to end and the "rapture". Pt says "I don't know why everyone is not ready for this world to end". Pt denies that she has any plans of killing herself. Pt denies any previous suicide attempts. Pt expresses frustration at having to live with her sister and wants to be able to live back on her own. Pt shares that she has been on the waiting list for a rent-controlled apartment since last year and is anxious to get picked. Pt denies HI, AVH. Pt denies currently taking any psychotropic meds and last took Lexapro in October. Pt is interested in OP therapy. Pt is tearful during assessment, but denies any suicidal plan or intent. No indication of pt responding to internal stimuli or operating in delusional thought.   Diagnosis: Adjustment d/o, w/ depressed mood  Past Medical History:  Past Medical History:  Diagnosis Date  . Anxiety   . Diabetes mellitus without complication ([place])   . Hypertension     Past Surgical History:  Procedure Laterality Date  . CHOLECYSTECTOMY      Family History:  Family History  Problem Relation Age of Onset  . Diabetes Mother   . Arthritis Mother   . Heart disease Mother   . Cancer Mother   . Hyperlipidemia Mother   . COPD Father     Social History:  reports that  has never smoked. she has never used smokeless tobacco. She reports that she does not drink alcohol or use drugs.  Additional Social History:  Alcohol / Drug Use Pain Medications: see PTA meds Prescriptions: see PTA meds Over the Counter: see PTA meds History of alcohol / drug use?: No history of alcohol / drug abuse  CIWA: CIWA-Ar BP: 123/76 Pulse Rate: 81 COWS:     Allergies:  Allergies  Allergen Reactions  . Asparagus Nausea Only  . Codeine Nausea Only and Other (See Comments)    dizziness  . Eggs Or Egg-Derived Products Nausea Only    Home Medications:  (Not in a hospital admission)  OB/GYN Status:  No LMP recorded.  General Assessment Data Location of Assessment: [facility] ED TTS Assessment: In system Is this a Tele or Face-to-Face Assessment?: Face-to-Face Is this an Initial Assessment or a Re-assessment for this encounter?: Initial Assessment Marital status: Single Is patient pregnant?: No Pregnancy Status: No Living Arrangements: Other relatives Can pt return to current living arrangement?: Yes Admission Status: Voluntary Is patient capable of signing voluntary admission?: Yes Referral Source: Self/Family/Friend     Crisis Care Plan Living Arrangements: Other relatives Name of Psychiatrist: none  Name of Therapist: none  Education Status Is patient currently in school?: No  Risk to self with the past 6 months Suicidal Ideation: No-Not Currently/Within Last 6 Months Has patient been a risk to self within the past 6 months prior to admission? : No Suicidal Intent: No Has patient had any suicidal intent within the past 6 months prior to admission? : No Is patient at risk for suicide?: No Suicidal Plan?: No Has patient had any suicidal plan within the past 6 months prior to admission? : No  Access to Means: No Previous Attempts/Gestures: No Intentional Self Injurious Behavior: None Family Suicide History: Unknown Recent stressful life event(s): Job Loss, Financial Problems Persecutory voices/beliefs?: No Depression: Yes Depression Symptoms: Feeling worthless/self pity, Feeling angry/irritable, Tearfulness Substance abuse history and/or treatment for substance abuse?: No Suicide prevention information given to non-admitted patients: Yes  Risk to Others within the past 6 months Homicidal Ideation: No Does patient have any  lifetime risk of violence toward others beyond the six months prior to admission? : No Thoughts of Harm to Others: No Current Homicidal Intent: No Current Homicidal Plan: No Access to Homicidal Means: No History of harm to others?: No Assessment of Violence: None Noted Does patient have access to weapons?: No Criminal Charges Pending?: No Does patient have a court date: No Is patient on probation?: No  Psychosis Hallucinations: None noted Delusions: None noted  Mental Status Report Appearance/Hygiene: Unremarkable Eye Contact: Good Motor Activity: Unremarkable Speech: Logical/coherent Level of Consciousness: Alert Mood: Depressed Affect: Appropriate to circumstance Anxiety Level: Moderate Thought Processes: Coherent, Relevant Judgement: Unimpaired Orientation: Person, Place, Time, Situation Obsessive Compulsive Thoughts/Behaviors: None  Cognitive Functioning Concentration: Normal Memory: Recent Intact, Remote Intact IQ: Average Insight: Good Impulse Control: Good Appetite: Fair Sleep: No Change Vegetative Symptoms: None  ADLScreening [facility] Assessment Services) Patient's cognitive ability adequate to safely complete daily activities?: Yes Patient able to express need for assistance with ADLs?: Yes Independently performs ADLs?: Yes (appropriate for developmental age)  Prior Inpatient Therapy Prior Inpatient Therapy: No  Prior Outpatient Therapy Prior Outpatient Therapy: Yes Prior Therapy Dates: many therapists throughout lifetime Prior Therapy Facilty/Provider(s): multiple Reason for Treatment: depression; anxiety Does patient have an ACCT team?: No Does patient have Intensive In-House Services?  : No Does patient have [facility] services? : No Does patient have P4CC services?: No  ADL Screening (condition at time of admission) Patient's cognitive ability adequate to safely complete daily activities?: Yes Is the patient deaf or have difficulty hearing?: No Does  the patient have difficulty seeing, even when wearing glasses/contacts?: No Does the patient have difficulty concentrating, remembering, or making decisions?: No Patient able to express need for assistance with ADLs?: Yes Does the patient have difficulty dressing or bathing?: No Independently performs ADLs?: Yes (appropriate for developmental age) Does the patient have difficulty walking or climbing stairs?: No Weakness of Legs: None Weakness of Arms/Hands: None  Home Assistive Devices/Equipment Home Assistive Devices/Equipment: None    Abuse/Neglect Assessment (Assessment to be complete while patient is alone) Abuse/Neglect Assessment Can Be Completed: Yes Physical Abuse: Denies Verbal Abuse: Denies Sexual Abuse: Denies Exploitation of patient/patient's resources: Denies Self-Neglect: Denies     Regulatory affairs officer (For Healthcare) Does Patient Have a Medical Advance Directive?: No Would patient like information on creating a medical advance directive?: Yes (ED - Information included in AVS) Nutrition Screen- [facility] Adult/[facility] Patient's home diet: Regular Has the patient recently lost weight without trying?: No Has the patient been eating poorly because of a decreased appetite?: No Malnutrition Screening Tool Score: 0  Additional Information 1:1 In Past 12 Months?: No CIRT Risk: No Elopement Risk: No Does patient have medical clearance?: Yes     Disposition:  Disposition Initial Assessment Completed for this Encounter: Yes(consulted with Dr. Buford Dresser) Disposition of Patient: Discharge with Outpatient Resources  On Site Evaluation by:   Reviewed with Physician:    Rexene Edison 04/25/2017 12:36 PM

## 2017-04-25 NOTE — ED Notes (Signed)
Report given to RN

## 2017-04-25 NOTE — Discharge Instructions (Signed)
Try zantac 150mg twice a day.  ° ° °

## 2017-04-26 ENCOUNTER — Encounter (HOSPITAL_COMMUNITY): Payer: Self-pay | Admitting: Emergency Medicine

## 2017-05-03 ENCOUNTER — Ambulatory Visit (INDEPENDENT_AMBULATORY_CARE_PROVIDER_SITE_OTHER): Payer: Self-pay | Admitting: Family Medicine

## 2017-05-03 ENCOUNTER — Encounter: Payer: Self-pay | Admitting: Family Medicine

## 2017-05-03 VITALS — BP 100/70 | HR 60 | Temp 97.7°F | Ht 60.0 in | Wt 173.0 lb

## 2017-05-03 DIAGNOSIS — R45851 Suicidal ideations: Secondary | ICD-10-CM

## 2017-05-03 DIAGNOSIS — F449 Dissociative and conversion disorder, unspecified: Secondary | ICD-10-CM

## 2017-05-03 DIAGNOSIS — F331 Major depressive disorder, recurrent, moderate: Secondary | ICD-10-CM

## 2017-05-03 NOTE — Progress Notes (Signed)
Patient ID: VIRGINIA CURL, female    DOB: 1965/10/04, 52 y.o.   MRN: [id number]  PCP: Scot Jun, FNP  Chief Complaint  Patient presents with  . Follow-up    Suicidal ideations    Subjective:  HPI Vickie Marshall is a 52 y.o. female presents for evaluation of recent episode of major depression with psychosis. Brandey was seen in office on 04/25/2017 and verbalized during that visit wishing and researching different methods to end her life due to worsening financial problems and unemployment.  Today she presents in a more stable mood and reports that she has reached out sent to psychiatric services in efforts to establish with a mental health provider although has been unsuccessful as she does not have insurance.  Denies suicidal ideations today. However she does not want to discuss her mental health situation today she prefers to request a screening for CA 125 cancer marker as she feels that she has ovarian cancer.  She reports that she experiences abdominal bloating, nausea, fatigue, and abdominal pain all of which she associates with having ovarian cancer.  She has no family history of ovarian or any female related cancers.  No family history of breast cancer.  No personal history of any cancers.  She reports she presented to the emergency department also requesting the same exams and request was denied.  She was also referred to a gynecology office in [facility] in which the provider also declined to run these tests.  She has not experienced any unintentional weight gain, sensation of feeling for quickly, vaginal bleeding, or pelvic pain. Social History   Socioeconomic History  . Marital status: Single    Spouse name: Not on file  . Number of children: Not on file  . Years of education: Not on file  . Highest education level: Not on file  Social Needs  . Financial resource strain: Not on file  . Food insecurity - worry: Not on file  . Food insecurity - inability: Not on file  .  Transportation needs - medical: Not on file  . Transportation needs - non-medical: Not on file  Occupational History  . Not on file  Tobacco Use  . Smoking status: Never Smoker  . Smokeless tobacco: Never Used  Substance and Sexual Activity  . Alcohol use: No  . Drug use: No  . Sexual activity: Not on file  Other Topics Concern  . Not on file  Social History Narrative  . Not on file    Family History  Problem Relation Age of Onset  . Diabetes Mother   . Arthritis Mother   . Heart disease Mother   . Cancer Mother   . Hyperlipidemia Mother   . COPD Father    Review of Systems  Constitutional: Positive for fatigue.  HENT: Negative.   Respiratory: Negative.   Gastrointestinal: Positive for abdominal distention, abdominal pain and nausea. Negative for anal bleeding, blood in stool, rectal pain and vomiting.  Genitourinary: Negative.   Psychiatric/Behavioral: Positive for dysphoric mood.   There are no active problems to display for this patient.   Allergies  Allergen Reactions  . Asparagus Nausea Only  . Codeine Nausea Only and Other (See Comments)    dizziness  . Eggs Or Egg-Derived Products Nausea Only    Prior to Admission medications   Medication Sig Start Date End Date Taking? Authorizing Provider  cetirizine (ZYRTEC) 10 MG tablet Take 10 mg by mouth daily.   Yes [provider]  HYDROcodone-acetaminophen (NORCO/VICODIN) 5-325 MG tablet Take 1 tablet by mouth every 6 (six) hours as needed for severe pain. 10/28/16  Yes Mesner, Corene Cornea, MD  ibuprofen (ADVIL,MOTRIN) 800 MG tablet Take 1 tablet (800 mg total) by mouth 3 (three) times daily. 10/28/16  Yes Mesner, Corene Cornea, MD  lisinopril-hydrochlorothiazide (PRINZIDE,ZESTORETIC) 20-25 MG tablet Take 1 tablet by mouth daily.   Yes [provider]  metFORMIN (GLUCOPHAGE) 500 MG tablet Take 1 tablet (500 mg total) by mouth daily with breakfast. Patient taking differently: Take 500 mg by mouth 2 (two) times  daily with a meal.  12/12/16  Yes Scot Jun, FNP  metroNIDAZOLE (FLAGYL) 500 MG tablet Take 1 tablet (500 mg total) by mouth 2 (two) times daily. 12/16/16  Yes Scot Jun, FNP  Multiple Vitamin (MULTIVITAMIN) tablet Take 1 tablet by mouth daily.   Yes [provider]  escitalopram (LEXAPRO) 10 MG tablet Take 2 tablets (20 mg total) by mouth at bedtime. Patient not taking: Reported on 05/03/2017 12/12/16   Scot Jun, FNP  lisinopril (PRINIVIL,ZESTRIL) 10 MG tablet Take 1 tablet (10 mg total) by mouth daily. Patient not taking: Reported on 05/03/2017 12/12/16   Scot Jun, FNP    Past Medical, Surgical Family and Social History reviewed and updated.    Objective:   Today's Vitals   05/03/17 1102  BP: 100/70  Pulse: 60  Temp: 97.7 F (36.5 C)  TempSrc: Oral  SpO2: 99%  Weight: 173 lb (78.5 kg)  Height: 5' (1.524 m)    Wt Readings from Last 3 Encounters:  05/03/17 173 lb (78.5 kg)  04/25/17 173 lb (78.5 kg)  04/25/17 173 lb (78.5 kg)    Physical Exam  Constitutional: She is oriented to person, place, and time. She appears well-developed and well-nourished.  HENT:  Head: Normocephalic and atraumatic.  Eyes: Conjunctivae and EOM are normal. Pupils are equal, round, and reactive to light.  Neck: Normal range of motion. Neck supple.  Cardiovascular: Normal rate, regular rhythm, normal heart sounds and intact distal pulses.  Pulmonary/Chest: Effort normal and breath sounds normal.  Abdominal: Soft. Bowel sounds are normal.  Musculoskeletal: Normal range of motion.  Lymphadenopathy:    She has no cervical adenopathy.  Neurological: She is alert and oriented to person, place, and time.  Psychiatric: Her speech is normal and behavior is normal. Her affect is blunt. Thought content is paranoid. Cognition and memory are normal. She expresses impulsivity and inappropriate judgment. She expresses no suicidal plans and no homicidal plans.    Assessment & Plan:  1. Psychologic conversion disorder, patient is convinced that she has ovarian cancer and request further work and evaluation. I declined screening patient with a CA-125 marker as this test is non-specific and runs the risk of false -positives. Given patient's underlying mental health symptoms, I suspect patients self diagnosis of "ovarian cancer" is arousing from a form of conversion disorder. I agreed to submit a referral on her behalf to [facility] although I advised I could not guarantee which diagnostic tests would be performed. She verbalized understanding. 2. Suicidal ideations, not active today.  3. Moderate episode of recurrent major depressive disorder ([place]), stable today. Patient refuses to take psychotropic medications for management of depression. Previously prescribed several and desired affect was not achieved.       Carroll Sage. Kenton Kingfisher, MSN, FNP-C [facility]  [address] [place], [place] [address] [phone]

## 2017-05-11 DIAGNOSIS — F449 Dissociative and conversion disorder, unspecified: Secondary | ICD-10-CM | POA: Insufficient documentation

## 2017-05-11 DIAGNOSIS — F331 Major depressive disorder, recurrent, moderate: Secondary | ICD-10-CM | POA: Insufficient documentation

## 2017-05-11 DIAGNOSIS — R45851 Suicidal ideations: Secondary | ICD-10-CM | POA: Insufficient documentation

## 2017-06-07 ENCOUNTER — Telehealth: Payer: Self-pay

## 2017-06-07 MED ORDER — METFORMIN HCL 500 MG PO TABS: 500.0000 mg | ORAL_TABLET | Freq: Every day | ORAL | 3 refills | Status: DC

## 2017-06-07 NOTE — Telephone Encounter (Signed)
Prescribed metformin 500 mg once daily. Patient has a history of type 2 diabetes. Last A1C 6.0. Needs an updated A1C within the next 30 days. Scheduled a lab visit.

## 2017-06-07 NOTE — Telephone Encounter (Signed)
Left a vm for patient to callback 

## 2017-06-07 NOTE — Telephone Encounter (Signed)
Patient would like to have a refill on Metformin 500mg  sent to pharmacy. I didn't see it on her current medication list.

## 2017-06-08 NOTE — Telephone Encounter (Signed)
Tried to contact patient no answer 

## 2017-06-12 NOTE — Telephone Encounter (Signed)
Letter sent to patient as I have been unable to contact her by phone

## 2017-08-05 DIAGNOSIS — N390 Urinary tract infection, site not specified: Secondary | ICD-10-CM

## 2017-08-05 HISTORY — DX: Urinary tract infection, site not specified: N39.0

## 2017-08-23 ENCOUNTER — Ambulatory Visit (INDEPENDENT_AMBULATORY_CARE_PROVIDER_SITE_OTHER): Payer: Self-pay | Admitting: Family Medicine

## 2017-08-23 ENCOUNTER — Encounter: Payer: Self-pay | Admitting: Family Medicine

## 2017-08-23 VITALS — BP 114/70 | HR 90 | Temp 98.0°F | Ht 60.0 in | Wt 170.2 lb

## 2017-08-23 DIAGNOSIS — Z09 Encounter for follow-up examination after completed treatment for conditions other than malignant neoplasm: Secondary | ICD-10-CM

## 2017-08-23 DIAGNOSIS — Z23 Encounter for immunization: Secondary | ICD-10-CM

## 2017-08-23 DIAGNOSIS — G8929 Other chronic pain: Secondary | ICD-10-CM

## 2017-08-23 DIAGNOSIS — N390 Urinary tract infection, site not specified: Secondary | ICD-10-CM

## 2017-08-23 DIAGNOSIS — Z0000 Encounter for general adult medical examination without abnormal findings: Secondary | ICD-10-CM

## 2017-08-23 DIAGNOSIS — R8290 Unspecified abnormal findings in urine: Secondary | ICD-10-CM

## 2017-08-23 DIAGNOSIS — E1140 Type 2 diabetes mellitus with diabetic neuropathy, unspecified: Secondary | ICD-10-CM

## 2017-08-23 DIAGNOSIS — R319 Hematuria, unspecified: Secondary | ICD-10-CM

## 2017-08-23 DIAGNOSIS — M25561 Pain in right knee: Secondary | ICD-10-CM

## 2017-08-23 DIAGNOSIS — M549 Dorsalgia, unspecified: Secondary | ICD-10-CM

## 2017-08-23 DIAGNOSIS — E119 Type 2 diabetes mellitus without complications: Secondary | ICD-10-CM

## 2017-08-23 DIAGNOSIS — F419 Anxiety disorder, unspecified: Secondary | ICD-10-CM

## 2017-08-23 DIAGNOSIS — R232 Flushing: Secondary | ICD-10-CM

## 2017-08-23 LAB — POCT URINALYSIS DIP (MANUAL ENTRY)
Bilirubin, UA: NEGATIVE
Glucose, UA: NEGATIVE mg/dL
Ketones, POC UA: NEGATIVE mg/dL
Nitrite, UA: NEGATIVE
Protein Ur, POC: NEGATIVE mg/dL
Spec Grav, UA: 1.015 (ref 1.010–1.025)
Urobilinogen, UA: 0.2 E.U./dL
pH, UA: 7 (ref 5.0–8.0)

## 2017-08-23 LAB — POCT GLYCOSYLATED HEMOGLOBIN (HGB A1C): Hemoglobin A1C: 6.1 % — AB (ref 4.0–5.6)

## 2017-08-23 MED ORDER — SULFAMETHOXAZOLE-TRIMETHOPRIM 800-160 MG PO TABS: 1.0000 | ORAL_TABLET | Freq: Two times a day (BID) | ORAL | 0 refills | Status: DC

## 2017-08-23 NOTE — Progress Notes (Signed)
Subjective:    Patient ID: Vickie Marshall, female    DOB: 03-Nov-1965, 52 y.o.   MRN: [id number]  PCP: Kathe Becton, NP  Chief Complaint  Patient presents with  . Arm Pain    Rright side  . Numbness    in one part of back    HPI  Vickie Marshall has a history of Hypertension, Diabetes, and Anxiety. She is here today because of new problems with peripheral neuropathy.   Current Status: Today she is her because of numbness/tingling/pain in right arm. She also reports the same 'sensation' in her legs and feet.   She denies fevers, chills, fatigue, recent infections, and weight loss. She has frequent hot flashes. She reports recent visual changes, and she will schedule Optometry appointment soon. She has not had any headaches, dizziness, and falls.   No reports of chest pain, heart palpitations, cough and shortness of breath reported.   She has mild alternating constipation and diarrhea. She has no reports of blood in stools, dysuria and hematuria.   She denies depression or anxiety.   She has chronic right knee and other generalized pain today.   Past Medical History:  Diagnosis Date  . Anxiety   . Diabetes mellitus without complication ([place])   . Hypertension     Family History  Problem Relation Age of Onset  . Diabetes Mother   . Arthritis Mother   . Heart disease Mother   . Cancer Mother   . Hyperlipidemia Mother   . COPD Father     Social History   Socioeconomic History  . Marital status: Single    Spouse name: Not on file  . Number of children: Not on file  . Years of education: Not on file  . Highest education level: Not on file  Occupational History  . Not on file  Social Needs  . Financial resource strain: Not on file  . Food insecurity:    Worry: Not on file    Inability: Not on file  . Transportation needs:    Medical: Not on file    Non-medical: Not on file  Tobacco Use  . Smoking status: Never Smoker  . Smokeless tobacco: Never Used  Substance  and Sexual Activity  . Alcohol use: No  . Drug use: No  . Sexual activity: Not on file  Lifestyle  . Physical activity:    Days per week: Not on file    Minutes per session: Not on file  . Stress: Not on file  Relationships  . Social connections:    Talks on phone: Not on file    Gets together: Not on file    Attends religious service: Not on file    Active member of club or organization: Not on file    Attends meetings of clubs or organizations: Not on file    Relationship status: Not on file  . Intimate partner violence:    Fear of current or ex partner: Not on file    Emotionally abused: Not on file    Physically abused: Not on file    Forced sexual activity: Not on file  Other Topics Concern  . Not on file  Social History Narrative  . Not on file    Past Surgical History:  Procedure Laterality Date  . CHOLECYSTECTOMY     Immunization History  Administered Date(s) Administered  . Tdap 08/23/2017    Current Meds  Medication Sig  . cetirizine (ZYRTEC) 10 MG tablet Take 10  mg by mouth daily.  Marland Kitchen lisinopril-hydrochlorothiazide (PRINZIDE,ZESTORETIC) 20-25 MG tablet Take 20-25 tablets by mouth.  . metFORMIN (GLUCOPHAGE) 500 MG tablet Take 1 tablet (500 mg total) by mouth daily with breakfast.  . Multiple Vitamin (MULTIVITAMIN) tablet Take 1 tablet by mouth daily.    Allergies  Allergen Reactions  . Asparagus Nausea Only  . Codeine Nausea Only and Other (See Comments)    dizziness  . Eggs Or Egg-Derived Products Nausea Only    BP 114/70 (BP Location: Left Arm, Patient Position: Sitting, Cuff Size: Large)   Pulse 90   Temp 98 F (36.7 C) (Oral)   Ht 5' (1.524 m)   Wt 170 lb 3.2 oz (77.2 kg)   LMP 04/18/2017   SpO2 99%   BMI 33.24 kg/m   Review of Systems  Constitutional: Negative.   HENT: Negative.   Eyes: Negative.   Respiratory: Negative.   Cardiovascular: Negative.   Gastrointestinal: Negative.   Endocrine: Negative.   Genitourinary: Negative.    Musculoskeletal: Negative.   Skin: Negative.   Allergic/Immunologic: Negative.   Neurological: Negative.   Hematological: Negative.   Psychiatric/Behavioral: Negative.    Objective:   Physical Exam  Constitutional: She is oriented to person, place, and time. She appears well-developed.  HENT:  Head: Normocephalic and atraumatic.  Right Ear: External ear normal.  Left Ear: External ear normal.  Nose: Nose normal.  Mouth/Throat: Oropharynx is clear and moist.  Eyes: Pupils are equal, round, and reactive to light. Conjunctivae and EOM are normal.  Neck: Normal range of motion. Neck supple.  Cardiovascular: Normal rate, regular rhythm, normal heart sounds and intact distal pulses.  Pulmonary/Chest: Effort normal and breath sounds normal.  Abdominal: Soft. Bowel sounds are normal.  Musculoskeletal: Normal range of motion.  Neurological: She is alert and oriented to person, place, and time.  Skin: Skin is warm and dry. Capillary refill takes less than 2 seconds.  Psychiatric: She has a normal mood and affect. Her behavior is normal. Judgment and thought content normal.  Nursing note and vitals reviewed.  Assessment & Plan:   1. Back pain, unspecified back location, unspecified back pain laterality, unspecified chronicity - POCT urinalysis dipstick  2. Type 2 diabetes mellitus without complication, without long-term current use of insulin (HCC) Hgb A1c is 6.1 today. She will continue with DASH diet, increasing water intake, and increasing activity. We will continue to monitor. - POCT glycosylated hemoglobin (Hb A1C)  3. Health care maintenance - CBC with Differential - Comprehensive metabolic panel - Lipid Panel - TSH - HIV antibody (with reflex)  4. Urinary tract infection with hematuria, site unspecified Trace Blood and Leukocytes today.  - sulfamethoxazole-trimethoprim (BACTRIM DS,SEPTRA DS) 800-160 MG tablet; Take 1 tablet by mouth 2 (two) times daily.  Dispense: 20  tablet; Refill: 0  5. Neuropathy due to type 2 diabetes mellitus (HCC) Mild. She is stable today. We will continue to monitor.   6. Need for Tdap vaccination - Tdap vaccine greater than or equal to 7yo IM.  7. Hot flashes Moderate. She is using Peppermint Oil and Coconut Oil to help with symptoms.   8. Anxiety Stable. Will continue to monitor.   9. Chronic pain of right knee She will try OTC glucosamine for joint pain relief.   10. Abnormal urinalysis - Urine Culture  11. Follow up She will follow up in 3 months.   Meds ordered this encounter  Medications  . sulfamethoxazole-trimethoprim (BACTRIM DS,SEPTRA DS) 800-160 MG tablet    Sig:  Take 1 tablet by mouth 2 (two) times daily.    Dispense:  20 tablet    Refill:  0    Kathe Becton,  MSN, FNP-BC Patient [facility] [address] [place], [place] [address] [phone]

## 2017-08-24 LAB — CBC WITH DIFFERENTIAL/PLATELET
Basophils Absolute: 0 10*3/uL (ref 0.0–0.2)
Basos: 1 %
EOS (ABSOLUTE): 0.2 10*3/uL (ref 0.0–0.4)
Eos: 4 %
Hematocrit: 40 % (ref 34.0–46.6)
Hemoglobin: 13.7 g/dL (ref 11.1–15.9)
Immature Grans (Abs): 0 10*3/uL (ref 0.0–0.1)
Immature Granulocytes: 0 %
Lymphocytes Absolute: 2 10*3/uL (ref 0.7–3.1)
Lymphs: 45 %
MCH: 29.5 pg (ref 26.6–33.0)
MCHC: 34.3 g/dL (ref 31.5–35.7)
MCV: 86 fL (ref 79–97)
Monocytes Absolute: 0.4 10*3/uL (ref 0.1–0.9)
Monocytes: 10 %
Neutrophils Absolute: 1.7 10*3/uL (ref 1.4–7.0)
Neutrophils: 40 %
Platelets: 256 10*3/uL (ref 150–450)
RBC: 4.65 x10E6/uL (ref 3.77–5.28)
RDW: 14.7 % (ref 12.3–15.4)
WBC: 4.4 10*3/uL (ref 3.4–10.8)

## 2017-08-24 LAB — COMPREHENSIVE METABOLIC PANEL
ALT: 16 IU/L (ref 0–32)
AST: 23 IU/L (ref 0–40)
Albumin/Globulin Ratio: 1.6 (ref 1.2–2.2)
Albumin: 4.4 g/dL (ref 3.5–5.5)
Alkaline Phosphatase: 71 IU/L (ref 39–117)
BUN/Creatinine Ratio: 17 (ref 9–23)
BUN: 16 mg/dL (ref 6–24)
Bilirubin Total: 0.4 mg/dL (ref 0.0–1.2)
CO2: 25 mmol/L (ref 20–29)
Calcium: 9.7 mg/dL (ref 8.7–10.2)
Chloride: 100 mmol/L (ref 96–106)
Creatinine, Ser: 0.95 mg/dL (ref 0.57–1.00)
GFR calc Af Amer: 80 mL/min/{1.73_m2} (ref >59)
GFR calc non Af Amer: 70 mL/min/{1.73_m2} (ref >59)
Globulin, Total: 2.7 g/dL (ref 1.5–4.5)
Glucose: 95 mg/dL (ref 65–99)
Potassium: 3.9 mmol/L (ref 3.5–5.2)
Sodium: 140 mmol/L (ref 134–144)
Total Protein: 7.1 g/dL (ref 6.0–8.5)

## 2017-08-24 LAB — HIV ANTIBODY (ROUTINE TESTING W REFLEX): HIV Screen 4th Generation wRfx: NONREACTIVE

## 2017-08-24 LAB — LIPID PANEL
Chol/HDL Ratio: 2.7 ratio (ref 0.0–4.4)
Cholesterol, Total: 188 mg/dL (ref 100–199)
HDL: 70 mg/dL (ref >39)
LDL Calculated: 102 mg/dL — ABNORMAL HIGH (ref 0–99)
Triglycerides: 79 mg/dL (ref 0–149)
VLDL Cholesterol Cal: 16 mg/dL (ref 5–40)

## 2017-08-24 LAB — TSH: TSH: 1.61 u[IU]/mL (ref 0.450–4.500)

## 2017-08-25 LAB — URINE CULTURE

## 2017-09-01 ENCOUNTER — Telehealth: Payer: Self-pay

## 2017-09-01 NOTE — Telephone Encounter (Signed)
-----   Message from Azzie Glatter, [facility] sent at 08/31/2017 11:04 PM EDT ----- Regarding: "Lab Results" Please inform her that her most recent labs are stable.    Thanks!'

## 2017-09-01 NOTE — Telephone Encounter (Signed)
Patient notified

## 2017-10-13 ENCOUNTER — Other Ambulatory Visit: Payer: Self-pay

## 2017-10-13 NOTE — Telephone Encounter (Signed)
Did know about the Lisinopril-hydrochlorothiazide please advise.

## 2017-10-15 MED ORDER — LISINOPRIL-HYDROCHLOROTHIAZIDE 20-25 MG PO TABS: 1.0000 | ORAL_TABLET | Freq: Every day | ORAL | 1 refills | Status: DC

## 2017-10-15 MED ORDER — METFORMIN HCL 500 MG PO TABS: 500.0000 mg | ORAL_TABLET | Freq: Every day | ORAL | 3 refills | Status: DC

## 2017-10-31 ENCOUNTER — Ambulatory Visit: Payer: Medicaid Other | Admitting: Family Medicine

## 2018-01-13 ENCOUNTER — Encounter (HOSPITAL_COMMUNITY): Payer: Self-pay | Admitting: *Deleted

## 2018-01-13 ENCOUNTER — Other Ambulatory Visit: Payer: Self-pay

## 2018-01-13 ENCOUNTER — Inpatient Hospital Stay (HOSPITAL_COMMUNITY)
Admission: AD | Admit: 2018-01-13 | Discharge: 2018-01-17 | DRG: 885 | Disposition: A | Discharge status: Home / Self Care | Payer: Federal, State, Local not specified - Other | Source: Intra-hospital | Attending: Psychiatry | Admitting: Psychiatry

## 2018-01-13 ENCOUNTER — Encounter (HOSPITAL_COMMUNITY): Payer: Self-pay | Admitting: Emergency Medicine

## 2018-01-13 ENCOUNTER — Emergency Department (HOSPITAL_COMMUNITY)
Admission: EM | Admit: 2018-01-13 | Discharge: 2018-01-13 | Disposition: A | Discharge status: Other Acute Inpatient Hospital | Payer: Medicaid Other | Attending: Emergency Medicine | Admitting: Emergency Medicine

## 2018-01-13 DIAGNOSIS — F419 Anxiety disorder, unspecified: Secondary | ICD-10-CM | POA: Diagnosis present

## 2018-01-13 DIAGNOSIS — Z7984 Long term (current) use of oral hypoglycemic drugs: Secondary | ICD-10-CM

## 2018-01-13 DIAGNOSIS — I10 Essential (primary) hypertension: Secondary | ICD-10-CM | POA: Diagnosis present

## 2018-01-13 DIAGNOSIS — G4700 Insomnia, unspecified: Secondary | ICD-10-CM | POA: Diagnosis present

## 2018-01-13 DIAGNOSIS — R45850 Homicidal ideations: Secondary | ICD-10-CM | POA: Diagnosis present

## 2018-01-13 DIAGNOSIS — R45851 Suicidal ideations: Secondary | ICD-10-CM | POA: Diagnosis present

## 2018-01-13 DIAGNOSIS — Z91012 Allergy to eggs: Secondary | ICD-10-CM

## 2018-01-13 DIAGNOSIS — F333 Major depressive disorder, recurrent, severe with psychotic symptoms: Secondary | ICD-10-CM | POA: Diagnosis present

## 2018-01-13 DIAGNOSIS — Z91018 Allergy to other foods: Secondary | ICD-10-CM | POA: Diagnosis not present

## 2018-01-13 DIAGNOSIS — E119 Type 2 diabetes mellitus without complications: Secondary | ICD-10-CM | POA: Insufficient documentation

## 2018-01-13 DIAGNOSIS — Z79899 Other long term (current) drug therapy: Secondary | ICD-10-CM | POA: Insufficient documentation

## 2018-01-13 DIAGNOSIS — Z885 Allergy status to narcotic agent status: Secondary | ICD-10-CM

## 2018-01-13 DIAGNOSIS — R440 Auditory hallucinations: Secondary | ICD-10-CM | POA: Insufficient documentation

## 2018-01-13 DIAGNOSIS — F330 Major depressive disorder, recurrent, mild: Secondary | ICD-10-CM | POA: Insufficient documentation

## 2018-01-13 HISTORY — DX: Urinary tract infection, site not specified: N39.0

## 2018-01-13 LAB — COMPREHENSIVE METABOLIC PANEL
ALT: 20 U/L (ref 0–44)
AST: 24 U/L (ref 15–41)
Albumin: 4.4 g/dL (ref 3.5–5.0)
Alkaline Phosphatase: 65 U/L (ref 38–126)
Anion gap: 9 (ref 5–15)
BILIRUBIN TOTAL: 0.6 mg/dL (ref 0.3–1.2)
BUN: 10 mg/dL (ref 6–20)
CO2: 26 mmol/L (ref 22–32)
CREATININE: 0.97 mg/dL (ref 0.44–1.00)
Calcium: 9.7 mg/dL (ref 8.9–10.3)
Chloride: 103 mmol/L (ref 98–111)
Glucose, Bld: 98 mg/dL (ref 70–99)
Potassium: 3.4 mmol/L — ABNORMAL LOW (ref 3.5–5.1)
Sodium: 138 mmol/L (ref 135–145)
TOTAL PROTEIN: 7.7 g/dL (ref 6.5–8.1)

## 2018-01-13 LAB — ACETAMINOPHEN LEVEL

## 2018-01-13 LAB — CBC WITH DIFFERENTIAL/PLATELET
ABS IMMATURE GRANULOCYTES: 0.02 10*3/uL (ref 0.00–0.07)
Basophils Absolute: 0 10*3/uL (ref 0.0–0.1)
Basophils Relative: 1 %
Eosinophils Absolute: 0.3 10*3/uL (ref 0.0–0.5)
Eosinophils Relative: 4 %
HCT: 42.1 % (ref 36.0–46.0)
HEMOGLOBIN: 13.7 g/dL (ref 12.0–15.0)
IMMATURE GRANULOCYTES: 0 %
LYMPHS PCT: 37 %
Lymphs Abs: 2.1 10*3/uL (ref 0.7–4.0)
MCH: 29.1 pg (ref 26.0–34.0)
MCHC: 32.5 g/dL (ref 30.0–36.0)
MCV: 89.4 fL (ref 80.0–100.0)
Monocytes Absolute: 0.6 10*3/uL (ref 0.1–1.0)
Monocytes Relative: 11 %
NEUTROS ABS: 2.7 10*3/uL (ref 1.7–7.7)
NEUTROS PCT: 47 %
NRBC: 0 % (ref 0.0–0.2)
Platelets: 253 10*3/uL (ref 150–400)
RBC: 4.71 MIL/uL (ref 3.87–5.11)
RDW: 12.8 % (ref 11.5–15.5)
WBC: 5.7 10*3/uL (ref 4.0–10.5)

## 2018-01-13 LAB — I-STAT BETA HCG BLOOD, ED (MC, WL, AP ONLY): I-stat hCG, quantitative: <5 m[IU]/mL (ref <5)

## 2018-01-13 LAB — RAPID URINE DRUG SCREEN, HOSP PERFORMED
Amphetamines: NOT DETECTED
Barbiturates: NOT DETECTED
Benzodiazepines: NOT DETECTED
Cocaine: NOT DETECTED
Opiates: NOT DETECTED
Tetrahydrocannabinol: NOT DETECTED

## 2018-01-13 LAB — ETHANOL

## 2018-01-13 LAB — SALICYLATE LEVEL

## 2018-01-13 MED ORDER — ACETAMINOPHEN 325 MG PO TABS
650.0000 mg | ORAL_TABLET | Freq: Four times a day (QID) | ORAL | Status: DC | PRN
  Administered 2018-01-16: 650 mg via ORAL
  Filled 2018-01-13: qty 2

## 2018-01-13 MED ORDER — ALUM & MAG HYDROXIDE-SIMETH 200-200-20 MG/5ML PO SUSP: 30.0000 mL | ORAL | Status: DC | PRN

## 2018-01-13 MED ORDER — TRAZODONE HCL 50 MG PO TABS
50.0000 mg | ORAL_TABLET | Freq: Every evening | ORAL | Status: DC | PRN
  Administered 2018-01-14 – 2018-01-16 (×3): 50 mg via ORAL
  Filled 2018-01-13 (×2): qty 1
  Filled 2018-01-13: qty 14

## 2018-01-13 MED ORDER — MAGNESIUM HYDROXIDE 400 MG/5ML PO SUSP: 30.0000 mL | Freq: Every day | ORAL | Status: DC | PRN

## 2018-01-13 NOTE — ED Provider Notes (Signed)
[facility] EMERGENCY DEPARTMENT Provider Note   CSN: [id number] Arrival date & time: 01/13/18  1521     History   Chief Complaint Chief Complaint  Patient presents with  . Suicidal    HPI Vickie Marshall is a 52 y.o. female with a past medical history of hypertension, diabetes, anxiety, depression who presents to ED for suicidal ideations, auditory hallucinations and intermittent visual hallucinations.  Patient states that she has been off of her Lexapro for about 1 year due to insurance issues.  In the past she states that "I do not know if it really helped me anyways."  She reports having progressively worsening auditory hallucinations including a good demon and a bad demon and I am just in the middle of it all."  States that she is also having suicidal ideations and stating that "I tried to YouTube how to do it but I did not find what I was looking for."  She will sometimes have visual hallucinations described as "seeing spirits and people looking at me."  Denies any alcohol, tobacco or other drug use.  Denies any homicidal ideations.  Denies any somatic complaints.  HPI  Past Medical History:  Diagnosis Date  . Anxiety   . Diabetes mellitus without complication ([place])   . Hypertension   . Urinary tract infection 08/2017    Patient Active Problem List   Diagnosis Date Noted  . Suicidal ideations 05/11/2017  . 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 Moderate episode of recurrent major depressive disorder ([place]) 05/11/2017    Past Surgical History:  Procedure Laterality Date  . CHOLECYSTECTOMY       OB History   None      Home Medications    Prior to Admission medications   Medication Sig Start Date End Date Taking? Authorizing Provider  cetirizine (ZYRTEC) 10 MG tablet Take 10 mg by mouth daily.   Yes [provider]  lisinopril-hydrochlorothiazide (PRINZIDE,ZESTORETIC) 20-25 MG tablet Take 1 tablet by mouth daily. 10/15/17  Yes  Azzie Glatter, FNP  metFORMIN (GLUCOPHAGE) 500 MG tablet Take 1 tablet (500 mg total) by mouth daily with breakfast. 10/15/17  Yes Azzie Glatter, FNP  Multiple Vitamin (MULTIVITAMIN) tablet Take 1 tablet by mouth daily.   Yes [provider]  sulfamethoxazole-trimethoprim (BACTRIM DS,SEPTRA DS) 800-160 MG tablet Take 1 tablet by mouth 2 (two) times daily. Patient not taking: Reported on 01/13/2018 08/23/17   Azzie Glatter, FNP    Family History Family History  Problem Relation Age of Onset  . Diabetes Mother   . Arthritis Mother   . Heart disease Mother   . Cancer Mother   . Hyperlipidemia Mother   . COPD Father     Social History Social History   Tobacco Use  . Smoking status: Never Smoker  . Smokeless tobacco: Never Used  Substance Use Topics  . Alcohol use: No  . Drug use: No     Allergies   Asparagus; Codeine; and Eggs or egg-derived products   Review of Systems Review of Systems  Constitutional: Negative for appetite change, chills and fever.  HENT: Negative for ear pain, rhinorrhea, sneezing and sore throat.   Eyes: Negative for photophobia and visual disturbance.  Respiratory: Negative for cough, chest tightness, shortness of breath and wheezing.   Cardiovascular: Negative for chest pain and palpitations.  Gastrointestinal: Negative for abdominal pain, blood in stool, constipation, diarrhea, nausea and vomiting.  Genitourinary: Negative for dysuria, hematuria and urgency.  Musculoskeletal: Negative  for myalgias.  Skin: Negative for rash.  Neurological: Negative for dizziness, weakness and light-headedness.  Psychiatric/Behavioral: Positive for dysphoric mood, hallucinations and suicidal ideas.     Physical Exam Updated Vital Signs BP (!) 136/92   Pulse 83   Temp 97.9 F (36.6 C) (Oral)   Resp 18   Ht 5' (1.524 m)   SpO2 100%   BMI 33.24 kg/m   Physical Exam  Constitutional: She appears well-developed and well-nourished. No  distress.  HENT:  Head: Normocephalic and atraumatic.  Nose: Nose normal.  Eyes: Conjunctivae and EOM are normal. Left eye exhibits no discharge. No scleral icterus.  Neck: Normal range of motion. Neck supple.  Cardiovascular: Normal rate, regular rhythm, normal heart sounds and intact distal pulses. Exam reveals no gallop and no friction rub.  No murmur heard. Pulmonary/Chest: Effort normal and breath sounds normal. No respiratory distress.  Abdominal: Soft. Bowel sounds are normal. She exhibits no distension. There is no tenderness. There is no guarding.  Musculoskeletal: Normal range of motion. She exhibits no edema.  Neurological: She is alert. She exhibits normal muscle tone. Coordination normal.  Skin: Skin is warm and dry. No rash noted.  Psychiatric: Her affect is blunt. She is not actively hallucinating. She expresses suicidal ideation. She expresses no homicidal ideation. She expresses no suicidal plans and no homicidal plans.  Nursing note and vitals reviewed.    ED Treatments / Results  Labs (all labs ordered are listed, but only abnormal results are displayed) Labs Reviewed  COMPREHENSIVE METABOLIC PANEL - Abnormal; Notable for the following components:      Result Value   Potassium 3.4 (*)    All other components within normal limits  ACETAMINOPHEN LEVEL - Abnormal; Notable for the following components:   Acetaminophen (Tylenol), Serum <10 (*)    All other components within normal limits  ETHANOL  CBC WITH DIFFERENTIAL/PLATELET  SALICYLATE LEVEL  RAPID URINE DRUG SCREEN, HOSP PERFORMED  I-STAT BETA HCG BLOOD, ED ([facility], [facility], [facility] ONLY)    EKG None  Radiology No results found.  Procedures Procedures (including critical care time)  Medications Ordered in ED Medications - No data to display   Initial Impression / Assessment and Plan / ED Course  I have reviewed the triage vital signs and the nursing notes.  Pertinent labs & imaging results that were available  during my care of the patient were reviewed by me and considered in my medical decision making (see chart for details).     52 year old female with a past medical history of anxiety, depression presents to ED for suicidal ideations, auditory hallucinations and intermittent visual hallucinations.  States that the suicidal ideations and auditory hallucinations have been there for several years but gradually getting worse.  She has no specific plan but states that she tried to watch videos on YouTube about suicide.  Describes the auditory hallucinations as demons that are arguing against each other.  She denies any homicidal ideations.  She has been off of Lexapro for 1 year due to insurance issues.  She denies any somatic complaints.  Will obtain medical screening lab work and have patient be evaluated by TTS.  4:52 PM RUDS pending at this time.  However, remainder of medical screening lab work is unremarkable.  Patient is medically cleared for TTS evaluation and disposition.    Portions of this note were generated with [employer]obby[employer]. Dictation errors may occur despite best attempts at proofreading.  Final Clinical Impressions(s) / ED Diagnoses  Final diagnoses:  Suicidal ideations  Auditory hallucinations    ED Discharge Orders    None       Delia Heady, PA-C 01/13/18 1654    Drenda Freeze, MD 01/13/18 [phone]

## 2018-01-13 NOTE — ED Triage Notes (Signed)
Pt reports "viouces" for years, non complaint with meds d/t loss of insurance. Reports voices are getting worse, reports suicidal ideation. States she's been watching youtube videos to figure out how to kill herself.   Supposed to be on lexapro - hasn't had her meds for a year. Putting lotion in triage

## 2018-01-13 NOTE — ED Notes (Signed)
Pt arrived to [facility] - wearing burgundy scrubs. Pt ambulatory from stretcher to bed w/o difficulty. Sitter w/pt.

## 2018-01-13 NOTE — ED Notes (Signed)
Pts belongings inventoried and at nurses station.

## 2018-01-13 NOTE — ED Notes (Signed)
Pt voiced understanding and agreement w/tx plan - accepted to [facility]. Pt signed consent form - copy faxed to [facility], copy sent to Medical Records, and original placed in envelope for [facility]. ALL belongings - 4 labeled belongings bag, 1 valuables envelope, and 1 bag of home meds - inventoried and placed at nurses' desk for transport w/pt.

## 2018-01-13 NOTE — ED Notes (Signed)
Dinner tray ordered - CHO Mod.

## 2018-01-13 NOTE — [facility] Counselor (Signed)
Vickie Loveless, NP recommended in/outpatient psychiatric treatment. Per Glennon Hamilton, pt accepted to [facility] [id number] after 9 pm to Dr. Jake Samples. Notified EDP/staff.  Please fax completed voluntary paperwork to [phone] and call report to [phone]. Pt can be transported by [employer].

## 2018-01-13 NOTE — ED Notes (Signed)
Vickie Marshall is here for the pt to go to [facility]

## 2018-01-13 NOTE — BH Assessment (Addendum)
Tele Assessment Note   Patient Name: Vickie Marshall MRN: [id number] Referring Physician: Darl Householder Location of Patient: [facility] Location of Provider: [place] is an 52 y.o. female who presents voluntarily accompanied by reporting primary symptoms of depression, SI and hallucinations. Pt states that she almost cut wrists in a suicide attempt a few days ago and reports that she has been watching You Tube videos to get ideas of how to kill herself. She states that she hears command hallucinations, and she holds her fingers in her ears to try to stop them, but that isn't working anymore.  Pt acknowledges symptoms including crying spells, social withdrawal, loss of interest in usual pleasures, decreased concentration, fatigue, irritability, decreased sleep, decreased appetite and feelings of hopelessness.  Pt endorses SI, HI yesterday (wanted to "choke the life out of a lady at the hotel--I could visualize it happening"), psychosis, denies SA. PT describes nopast attempts, denies history of violence. Pt states that symptoms have been worsening in the past few weeks.  Pt identifies primary stressors as recent homelessness since the end of August--was kicked out of her sister's house and has been living in hotels. Pt identifies primary supports as none. Pt's social history includes--no SA history. Pt states work history includes--currently looking for a job. Pt identifies legal involvement as none. Pt identifies abuse history as none. Pt identifies current/previous treatment as none. Pt reports no current medication--has been off for about a year. Pt describes family MH/SA history--sister attempted suicide Pt has poor insight and judgment. Pt's memory is typical.? ? MSE: Pt is casually dressed, alert, oriented x4 with normal speech and normal motor behavior. Eye contact is good. Pt's mood is depressed and affect is depressed and anxious. Affect is congruent with mood.  Thought process is coherent and relevant. There is no indication that pt is currently responding to internal stimuli or experiencing delusional thought content. Pt was cooperative throughout assessment.   Priscille Loveless, NP recommended in/outpatient psychiatric treatment. Per Glennon Hamilton, pt accepted to [facility] 505-2 after 9 pm to Dr. Jake Samples. Notified EDP/staff. 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 Anxiety   . Diabetes mellitus without complication ([place])   . Hypertension   . Urinary tract infection 08/2017    Past Surgical History:  Procedure Laterality Date  . CHOLECYSTECTOMY      Family History:  Family History  Problem Relation Age of Onset  . Diabetes Mother   . Arthritis Mother   . Heart disease Mother   . Cancer Mother   . Hyperlipidemia Mother   . COPD Father     Social History:  reports that she has never smoked. She has never used smokeless tobacco. She reports that she does not drink alcohol or use drugs.  Additional Social History:  Alcohol / Drug Use Pain Medications: denies Prescriptions: denies Over the Counter: denies History of alcohol / drug use?: No history of alcohol / drug abuse Longest period of sobriety (when/how long): NA Negative Consequences of Use: (denies)  CIWA: CIWA-Ar BP: (!) 136/92 Pulse Rate: 83 COWS:    Allergies:  Allergies  Allergen Reactions  . Asparagus Nausea Only  . Codeine Nausea Only and Other (See Comments)    dizziness  . Eggs Or Egg-Derived Products Nausea Only    Home Medications:  (Not in a hospital admission)  OB/GYN Status:  No LMP  recorded.  General Assessment Data Location of Assessment: [facility] ED TTS Assessment: In system Is this a Tele or Face-to-Face Assessment?: Tele Assessment Is this an Initial Assessment or a Re-assessment for this encounter?: Initial Assessment Patient Accompanied by::  N/A Language Other than English: No Living Arrangements: Homeless/Shelter What gender do you identify as?: Female Marital status: Single Pregnancy Status: Unknown Living Arrangements: (hotel) Can pt return to current living arrangement?: Yes Admission Status: Voluntary Is patient capable of signing voluntary admission?: Yes Referral Source: Self/Family/Friend Insurance type: MCD     Crisis Care Plan Living Arrangements: (hotel) Name of Psychiatrist: none Name of Therapist: none  Education Status Is patient currently in school?: No Is the patient employed, unemployed or receiving disability?: Unemployed  Risk to self with the past 6 months Suicidal Ideation: Yes-Currently Present Has patient been a risk to self within the past 6 months prior to admission? : No Suicidal Intent: No Has patient had any suicidal intent within the past 6 months prior to admission? : Yes Is patient at risk for suicide?: Yes Suicidal Plan?: Yes-Currently Present Has patient had any suicidal plan within the past 6 months prior to admission? : Yes Specify Current Suicidal Plan: cut wrists Access to Means: Yes Specify Access to Suicidal Means: raZOR What has been your use of drugs/alcohol within the last 12 months?: DENIES Previous Attempts/Gestures: No Triggers for Past Attempts: Unpredictable Intentional Self Injurious Behavior: None Family Suicide History: (sister attempted) Recent stressful life event(s): Financial Problems, Turmoil (Comment)(looking for a job) Persecutory voices/beliefs?: No Depression: Yes Depression Symptoms: Insomnia, Isolating, Loss of interest in usual pleasures, Feeling worthless/self pity, Feeling angry/irritable Substance abuse history and/or treatment for substance abuse?: No Suicide prevention information given to non-admitted patients: Not applicable  Risk to Others within the past 6 months Homicidal Ideation: No-Not Currently/Within Last 6 Months Does patient  have any lifetime risk of violence toward others beyond the six months prior to admission? : No Thoughts of Harm to Others: Yes-Currently Present Comment - Thoughts of Harm to Others: wanted to choke a lady at the hotel yesterday Current Homicidal Intent: No Current Homicidal Plan: No Access to Homicidal Means: Yes Describe Access to Homicidal Means: hands Identified Victim: (lady at hotel (unknown name)) History of harm to others?: No Assessment of Violence: None Noted Does patient have access to weapons?: (scissors, kitchen knife) Criminal Charges Pending?: No Does patient have a court date: No Is patient on probation?: No  Psychosis Hallucinations: Auditory Delusions: None noted  Mental Status Report Appearance/Hygiene: Unremarkable, In scrubs Eye Contact: Good Motor Activity: Unremarkable Speech: Logical/coherent Level of Consciousness: Alert Mood: Anxious, Depressed Affect: Anxious, Depressed Anxiety Level: Severe Thought Processes: Coherent, Relevant Judgement: Impaired Orientation: Person, Place, Time, Situation, Appropriate for developmental age Obsessive Compulsive Thoughts/Behaviors: None  Cognitive Functioning Concentration: Poor Memory: Recent Intact, Remote Intact Is patient IDD: No Insight: Poor Impulse Control: Poor Appetite: Poor Have you had any weight changes? : Loss Amount of the weight change? (lbs): (12 lbs) Sleep: Decreased Total Hours of Sleep: 4 Vegetative Symptoms: None  ADLScreening [facility] Assessment Services) Patient's cognitive ability adequate to safely complete daily activities?: Yes Patient able to express need for assistance with ADLs?: Yes Independently performs ADLs?: Yes (appropriate for developmental age)  Prior Inpatient Therapy Prior Inpatient Therapy: Yes Prior Therapy Dates: February 2019 Prior Therapy Facilty/Provider(s): [facility] Reason for Treatment: depressed  Prior Outpatient Therapy Prior Outpatient Therapy: Yes Prior  Therapy Dates: ("a long time ago") Prior Therapy Facilty/Provider(s): (unknown) Reason for Treatment: (depression) Does  patient have an ACCT team?: No Does patient have Intensive In-House Services?  : No Does patient have [facility] services? : No Does patient have P4CC services?: No  ADL Screening (condition at time of admission) Patient's cognitive ability adequate to safely complete daily activities?: Yes Is the patient deaf or have difficulty hearing?: No Does the patient have difficulty seeing, even when wearing glasses/contacts?: No Does the patient have difficulty concentrating, remembering, or making decisions?: No Patient able to express need for assistance with ADLs?: Yes Does the patient have difficulty dressing or bathing?: No Independently performs ADLs?: Yes (appropriate for developmental age) Does the patient have difficulty walking or climbing stairs?: No Weakness of Legs: None Weakness of Arms/Hands: None  Home Assistive Devices/Equipment Home Assistive Devices/Equipment: None  Therapy Consults (therapy consults require a physician order) PT Evaluation Needed: No OT Evalulation Needed: No SLP Evaluation Needed: No Abuse/Neglect Assessment (Assessment to be complete while patient is alone) Abuse/Neglect Assessment Can Be Completed: Yes Physical Abuse: Denies Verbal Abuse: Denies Sexual Abuse: Denies Exploitation of patient/patient's resources: Denies Self-Neglect: Denies Values / Beliefs Cultural Requests During Hospitalization: None Spiritual Requests During Hospitalization: None Consults Spiritual Care Consult Needed: No Social Work Consult Needed: No Regulatory affairs officer (For Healthcare) Does Patient Have a Medical Advance Directive?: No Would patient like information on creating a medical advance directive?: Yes (ED - Information included in AVS)          Disposition:  Disposition Initial Assessment Completed for this Encounter: Yes Disposition of  Patient: Admit Type of inpatient treatment program: Adult Patient refused recommended treatment: No Mode of transportation if patient is discharged?: N/A  This service was provided via telemedicine using a 2-way, interactive audio and Radiographer, therapeutic.  Names of all persons participating in this telemedicine service and their role in this encounter. Name: Ellouise Newer, MS, [facility] Role: TTS Counselor             [facility] 01/13/2018 4:52 PM

## 2018-01-14 ENCOUNTER — Other Ambulatory Visit: Payer: Self-pay

## 2018-01-14 DIAGNOSIS — F333 Major depressive disorder, recurrent, severe with psychotic symptoms: Principal | ICD-10-CM

## 2018-01-14 LAB — LIPID PANEL
Cholesterol: 175 mg/dL (ref 0–200)
HDL: 71 mg/dL (ref >40)
LDL CALC: 91 mg/dL (ref 0–99)
Total CHOL/HDL Ratio: 2.5 RATIO
Triglycerides: 64 mg/dL (ref <150)
VLDL: 13 mg/dL (ref 0–40)

## 2018-01-14 LAB — HEMOGLOBIN A1C
Hgb A1c MFr Bld: 5.8 % — ABNORMAL HIGH (ref 4.8–5.6)
MEAN PLASMA GLUCOSE: 119.76 mg/dL

## 2018-01-14 LAB — GLUCOSE, CAPILLARY
GLUCOSE-CAPILLARY: 96 mg/dL (ref 70–99)
Glucose-Capillary: 110 mg/dL — ABNORMAL HIGH (ref 70–99)

## 2018-01-14 LAB — TSH: TSH: 1.707 u[IU]/mL (ref 0.350–4.500)

## 2018-01-14 MED ORDER — LISINOPRIL 20 MG PO TABS
20.0000 mg | ORAL_TABLET | Freq: Every day | ORAL | Status: DC
  Administered 2018-01-14 – 2018-01-17 (×4): 20 mg via ORAL
  Filled 2018-01-14 (×5): qty 1

## 2018-01-14 MED ORDER — POTASSIUM CHLORIDE 20 MEQ PO PACK
20.0000 meq | PACK | Freq: Two times a day (BID) | ORAL | Status: DC
  Filled 2018-01-14 (×2): qty 1

## 2018-01-14 MED ORDER — QUETIAPINE FUMARATE 50 MG PO TABS
50.0000 mg | ORAL_TABLET | Freq: Every day | ORAL | Status: DC
  Administered 2018-01-14: 50 mg via ORAL
  Filled 2018-01-14 (×2): qty 1

## 2018-01-14 MED ORDER — HYDROCHLOROTHIAZIDE 25 MG PO TABS
25.0000 mg | ORAL_TABLET | Freq: Every day | ORAL | Status: DC
  Administered 2018-01-14 – 2018-01-17 (×4): 25 mg via ORAL
  Filled 2018-01-14 (×6): qty 1

## 2018-01-14 MED ORDER — INSULIN ASPART 100 UNIT/ML ~~LOC~~ SOLN: 0.0000 [IU] | Freq: Every day | SUBCUTANEOUS | Status: DC

## 2018-01-14 MED ORDER — VENLAFAXINE HCL 37.5 MG PO TABS
37.5000 mg | ORAL_TABLET | Freq: Two times a day (BID) | ORAL | Status: DC
  Administered 2018-01-14 – 2018-01-15 (×2): 37.5 mg via ORAL
  Filled 2018-01-14 (×4): qty 1

## 2018-01-14 MED ORDER — POTASSIUM CHLORIDE 20 MEQ/15ML (10%) PO SOLN
20.0000 meq | Freq: Two times a day (BID) | ORAL | Status: AC
  Administered 2018-01-14 (×2): 20 meq via ORAL
  Filled 2018-01-14 (×2): qty 15

## 2018-01-14 MED ORDER — METFORMIN HCL 500 MG PO TABS
500.0000 mg | ORAL_TABLET | Freq: Two times a day (BID) | ORAL | Status: DC
  Administered 2018-01-14 – 2018-01-17 (×6): 500 mg via ORAL
  Filled 2018-01-14 (×8): qty 1

## 2018-01-14 MED ORDER — INSULIN ASPART 100 UNIT/ML ~~LOC~~ SOLN: 0.0000 [IU] | Freq: Three times a day (TID) | SUBCUTANEOUS | Status: DC

## 2018-01-14 MED ORDER — LORATADINE 10 MG PO TABS
10.0000 mg | ORAL_TABLET | Freq: Every day | ORAL | Status: DC
  Administered 2018-01-14 – 2018-01-17 (×4): 10 mg via ORAL
  Filled 2018-01-14 (×5): qty 1
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
## 2018-01-14 NOTE — Tx Team (Signed)
Initial Treatment Plan 01/14/2018 12:02 AM Vickie Marshall RJJ:[id number]    PATIENT STRESSORS: Financial difficulties Marital or family conflict Medication change or noncompliance Occupational concerns   PATIENT STRENGTHS: Ability for insight Average or above average intelligence General fund of knowledge Motivation for treatment/growth   PATIENT IDENTIFIED PROBLEMS: Depression Anxiety Suicidal thoughts Auditory hallucinations "Need to get depression and anxiety under control"                     DISCHARGE CRITERIA:  Ability to meet basic life and health needs Improved stabilization in mood, thinking, and/or behavior Reduction of life-threatening or endangering symptoms to within safe limits Verbal commitment to aftercare and medication compliance  PRELIMINARY DISCHARGE PLAN: Attend aftercare/continuing care group  PATIENT/FAMILY INVOLVEMENT: This treatment plan has been presented to and reviewed with the patient, Vickie Marshall, and/or family member, .  The patient and family have been given the opportunity to ask questions and make suggestions.  [place], [place], [place] 01/14/2018, 12:02 AM

## 2018-01-14 NOTE — Progress Notes (Signed)
Patient ID: Vickie Marshall, female   DOB: Mar 24, 1965, 52 y.o.   MRN: [id number]    D: Pt has been flat and depressed on the unit today. Pt has also be suspicious of people on the unit reporting that other people were going into her room. Pt did attend groups and engaged in treatment. Pt was restarted back on all home medication. Pt reported that her depression was a 5, her hopelessness was a 0, and her anxiety was a 2. Pt reported that her goal for today was to get depression under control. Pt reported being negative SI/HI, no AH/VH noted. A: 15 min checks continued for patient safety. R: Pt safety maintained.
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
## 2018-01-14 NOTE — Plan of Care (Signed)
  Problem: Activity: Goal: Interest or engagement in activities will improve Outcome: Progressing Note:  She reported attending groups today Goal: Sleeping patterns will improve Outcome: Progressing Note:  She reported that she wasn't able to sleep well last night.  She did take new hs medications and appears to be asleep.  We will continue to monitor

## 2018-01-14 NOTE — Progress Notes (Signed)
Vickie Marshall is a 52 year old female pt admitted on voluntary basis. On admission she does report that she has been feeling depressed and suicidal and reports that she brought herself into the hospital to get help. She denies any current SI on admission and is able to contract for safety while in the hospital. She also endorses auditory hallucinations and reports that she has been dealing with them for a long time and describes them as intrusive thoughts. She spoke about how she lost her job at [employer] and then she lost her insurance and lost her home and spoke about a downward spiral due to the job loss. She reports that she goes to a center for medication management for her diabetes and hypertension. She also reports that she was on lexapro in the past but reports that she has not been on that medication in over a year. She denies any substance abuse issues. She reports that she does not have a lot of family supports and reports that she is homeless and unsure of where she will go once she is discharged. Vickie Marshall was escorted to the unit, oriented to the milieu and safety maintained.

## 2018-01-14 NOTE — Progress Notes (Signed)
D:  Vickie Marshall was pleasant and cooperative this evening.  Vickie Marshall was up and visible on the unit.  Vickie Marshall has been attending groups and interacting well with staff and peers. Vickie Marshall stated Vickie Marshall day was pretty good and his hoping to sleep better tonight.  Vickie Marshall denied SI/HI or visual hallucinations but continues to hear voices "running around in my head."  Vickie Marshall denied any pain or discomfort and appeared to be in no physical distress.  Reviewed all medications that were given to Vickie Marshall today and Vickie Marshall reported looking forward to taking Vickie Marshall new medication at bedtime as Vickie Marshall believes that Vickie Marshall will sleep better if all the voices/thoughts "aren't running around in my mind all the time."  Vickie Marshall took Vickie Marshall HS medications without difficulty.  Vickie Marshall is currently resting with Vickie Marshall eyes closed and appears to be asleep. A:  1:1 with RN for support and encouragement.  Medications as ordered.  Q 15 minute checks maintained for safety.  Encouraged participation in group and unit activities.   R:  Vickie Marshall remains safe on the unit.  We will continue to monitor the progress towards Vickie Marshall goals.

## 2018-01-14 NOTE — [facility] Group Notes (Signed)
[place] LCSW Group Therapy Note  Date/Time:  01/14/2018  11:00AM-12:00PM  Type of Therapy and Topic:  Group Therapy:  Music and Mood  Participation Level:  Active   Description of Group: In this process group, members listened to a variety of genres of music and identified that different types of music evoke different responses.  Patients were encouraged to identify music that was soothing for them and music that was energizing for them.  Patients discussed how this knowledge can help with wellness and recovery in various ways including managing depression and anxiety as well as encouraging healthy sleep habits.    Therapeutic Goals: 1. Patients will explore the impact of different varieties of music on mood 2. Patients will verbalize the thoughts they have when listening to different types of music 3. Patients will identify music that is soothing to them as well as music that is energizing to them 4. Patients will discuss how to use this knowledge to assist in maintaining wellness and recovery 5. Patients will explore the use of music as a coping skill  Summary of Patient Progress:  At the beginning of group, patient expressed that she felt anxious, rating this a 5 on a 1-10 scale.  She expressed that several songs were "soothing" and "relaxing."  At the end of group she stated her anxiety was reduced slightly to a "3."  Therapeutic Modalities: Solution Focused Brief Therapy Activity   Selmer Dominion, LCSW

## 2018-01-14 NOTE — Progress Notes (Signed)
Adult Psychoeducational Group Note  Date:  01/14/2018 Time:  11:48 PM  Group Topic/Focus:  Wrap-Up Group:   The focus of this group is to help patients review their daily goal of treatment and discuss progress on daily workbooks.  Participation Level:  Did Not Attend  Participation Quality:  Did not attend  Affect:  Did not attend  Cognitive:  Did not attend  Insight: None  Engagement in Group:  Did not attend  Modes of Intervention:  Did not attend  Additional Comments:  Pt did not attend evening wrap up group tonight.  Candy Sledge 01/14/2018, 11:48 PM

## 2018-01-14 NOTE — [facility] Suicide Risk Assessment (Signed)
[facility] INPATIENT:  Family/Significant Other Suicide Prevention Education  Suicide Prevention Education:  Patient Refusal for Family/Significant Other Suicide Prevention Education: The patient Vickie Marshall has refused to provide written consent for family/significant other to be provided Family/Significant Other Suicide Prevention Education during admission and/or prior to discharge.  Physician notified.  Rolanda Jay 01/14/2018, 5:30 PM
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
## 2018-01-15 LAB — GLUCOSE, CAPILLARY: GLUCOSE-CAPILLARY: 114 mg/dL — AB (ref 70–99)

## 2018-01-15 LAB — PROLACTIN: PROLACTIN: 53.4 ng/mL — AB (ref 4.8–23.3)

## 2018-01-15 MED ORDER — FLUOXETINE HCL 20 MG PO CAPS
20.0000 mg | ORAL_CAPSULE | Freq: Every day | ORAL | Status: DC
  Administered 2018-01-15 – 2018-01-17 (×3): 20 mg via ORAL
  Filled 2018-01-15 (×5): qty 1

## 2018-01-15 MED ORDER — BUSPIRONE HCL 10 MG PO TABS
10.0000 mg | ORAL_TABLET | Freq: Three times a day (TID) | ORAL | Status: DC
  Administered 2018-01-15 – 2018-01-16 (×4): 10 mg via ORAL
  Filled 2018-01-15 (×7): qty 1
  Filled 2018-01-15: qty 2

## 2018-01-15 NOTE — Progress Notes (Signed)
Recreation Therapy Notes  INPATIENT RECREATION THERAPY ASSESSMENT  Patient Details Name: Vickie Marshall MRN: [id number] DOB: January 20, 1966 Today's Date: 01/15/2018       Information Obtained From: Patient  Able to Participate in Assessment/Interview: Yes  Patient Presentation: Alert  Reason for Admission (Per Patient): Other (Comments)(Depression, Anxiety, Thoughts)  Patient Stressors: Work, Other (Comment)(Loss of job; Financial struggles; Constant moving)  Coping Skills:   Journal, Sports, TV, Music, Exercise, Meditate, Deep Breathing, Prayer, Avoidance, Read, Hot Bath/Shower, Other (Comment)(Singing)  Leisure Interests (2+):  Individual - Reading, Music - Singing  Frequency of Recreation/Participation: Other (Comment)(Daily)  Awareness of Community Resources:  Yes  Community Resources:  Park, Art therapist  Current Use: Yes  If no, Barriers?:    Expressed Interest in [employer] Information: No  [employer] of Residence:  Investment banker, corporate  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

## 2018-01-15 NOTE — Progress Notes (Signed)
[facility] MD Progress Note  01/15/2018 8:09 AM Vickie Marshall  MRN:  [id number] Subjective:    Pt c/o intrusive thoughts of curse words- obscene words "not me" calls them racing thoughts but has OCD that has led to MDD and SI No SI today Educated about OCD  Principal Problem: MDD (major depressive disorder), recurrent, severe, with psychosis ([place]) Diagnosis:   Patient Active Problem List   Diagnosis Date Noted  . MDD (major depressive disorder), recurrent, severe, with psychosis ([place]) [F33.3] 01/13/2018  . Suicidal ideations [R45.851] 05/11/2017  . 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 Moderate episode of recurrent major depressive disorder ([place]) [F33.1] 05/11/2017   Total Time spent with patient: 20 minutes  Past Psychiatric History: past MDD/  Past Medical History:  Past Medical History:  Diagnosis Date  . Anxiety   . Diabetes mellitus without complication ([place])   . Hypertension   . Urinary tract infection 08/2017    Past Surgical History:  Procedure Laterality Date  . CHOLECYSTECTOMY     Family History:  Family History  Problem Relation Age of Onset  . Diabetes Mother   . Arthritis Mother   . Heart disease Mother   . Cancer Mother   . Hyperlipidemia Mother   . COPD Father    Family Psychiatric  History: no new info Social History:  Social History   Substance and Sexual Activity  Alcohol Use Yes     Social History   Substance and Sexual Activity  Drug Use No    Social History   Socioeconomic History  . Marital status: Single    Spouse name: Not on file  . Number of children: Not on file  . Years of education: Not on file  . Highest education level: Not on file  Occupational History  . Not on file  Social Needs  . Financial resource strain: Not on file  . Food insecurity:    Worry: Not on file    Inability: Not on file  . Transportation needs:    Medical: Not on file    Non-medical: Not on file  Tobacco Use  . Smoking status:  Never Smoker  . Smokeless tobacco: Never Used  Substance and Sexual Activity  . Alcohol use: Yes  . Drug use: No  . Sexual activity: Not Currently  Lifestyle  . Physical activity:    Days per week: Not on file    Minutes per session: Not on file  . Stress: Not on file  Relationships  . Social connections:    Talks on phone: Not on file    Gets together: Not on file    Attends religious service: Not on file    Active member of club or organization: Not on file    Attends meetings of clubs or organizations: Not on file    Relationship status: Not on file  Other Topics Concern  . Not on file  Social History Narrative  . Not on file   Additional Social History:                         Sleep: Good  Appetite:  Fair  Current Medications: Current Facility-Administered Medications  Medication Dose Route Frequency Provider Last Rate Last Dose  . acetaminophen (TYLENOL) tablet 650 mg  650 mg Oral Q6H PRN Derrill Center, NP      . alum & mag hydroxide-simeth (MAALOX/MYLANTA) 200-200-20 MG/5ML suspension 30 mL  30 mL Oral Q4H PRN Bobby Rumpf,  Marcy Panning, NP      . busPIRone (BUSPAR) tablet 10 mg  10 mg Oral TID [name], MD      . FLUoxetine (PROZAC) capsule 20 mg  20 mg Oral Daily [name], MD      . hydrochlorothiazide (HYDRODIURIL) tablet 25 mg  25 mg Oral Daily Derrill Center, NP   25 mg at 01/15/18 0737  . lisinopril (PRINIVIL,ZESTRIL) tablet 20 mg  20 mg Oral Daily Derrill Center, NP   20 mg at 01/15/18 0738  . loratadine (CLARITIN) tablet 10 mg  10 mg Oral Daily Derrill Center, NP   10 mg at 01/15/18 0739  . magnesium hydroxide (MILK OF MAGNESIA) suspension 30 mL  30 mL Oral Daily PRN Derrill Center, NP      . metFORMIN (GLUCOPHAGE) tablet 500 mg  500 mg Oral BID WC Derrill Center, NP   500 mg at 01/15/18 0739  . traZODone (DESYREL) tablet 50 mg  50 mg Oral QHS PRN Derrill Center, NP   50 mg at 01/14/18 0024    Lab Results:  Results for orders placed or  performed during the hospital encounter of 01/13/18 (from the past 48 hour(s))  Hemoglobin A1c     Status: Abnormal   Collection Time: 01/14/18  6:22 AM  Result Value Ref Range   Hgb A1c MFr Bld 5.8 (H) 4.8 - 5.6 %    Comment: (NOTE) Pre diabetes:          5.7%-6.4% Diabetes:              >6.4% Glycemic control for   <7.0% adults with diabetes    Mean Plasma Glucose 119.76 mg/dL    Comment: Performed at [facility], [place] [address] [place], [place] [address]  Lipid panel     Status: None   Collection Time: 01/14/18  6:22 AM  Result Value Ref Range   Cholesterol 175 0 - 200 mg/dL   Triglycerides 64 <150 mg/dL   HDL 71 >40 mg/dL   Total CHOL/HDL Ratio 2.5 RATIO   VLDL 13 0 - 40 mg/dL   LDL Cholesterol 91 0 - 99 mg/dL    Comment:        Total Cholesterol/HDL:CHD Risk Coronary Heart Disease Risk Table                     Men   Women  1/2 Average Risk   3.4   3.3  Average Risk       5.0   4.4  2 X Average Risk   9.6   7.1  3 X Average Risk  23.4   11.0        Use the calculated Patient Ratio above and the CHD Risk Table to determine the patient's CHD Risk.        ATP III CLASSIFICATION (LDL):  <100     mg/dL   Optimal  100-129  mg/dL   Near or Above                    Optimal  130-159  mg/dL   Borderline  160-189  mg/dL   High  >190     mg/dL   Very High Performed at [place] [address], [place], [place] [address]   TSH     Status: None   Collection Time: 01/14/18  6:22 AM  Result Value Ref Range   TSH  1.707 0.350 - 4.500 uIU/mL    Comment: Performed by a 3rd Generation assay with a functional sensitivity of <=0.01 uIU/mL. Performed at [facility], [place] [address] [place], [place] [address]   Prolactin     Status: Abnormal   Collection Time: 01/14/18  6:22 AM  Result Value Ref Range   Prolactin 53.4 (H) 4.8 - 23.3 ng/mL    Comment: (NOTE) Performed At: [facility] [address] [place], [place]  [id number] Rush Farmer MD CV:[id number]   Glucose, capillary     Status: None   Collection Time: 01/14/18  9:21 AM  Result Value Ref Range   Glucose-Capillary 96 70 - 99 mg/dL  Glucose, capillary     Status: Abnormal   Collection Time: 01/14/18  4:40 PM  Result Value Ref Range   Glucose-Capillary 110 (H) 70 - 99 mg/dL  Glucose, capillary     Status: Abnormal   Collection Time: 01/15/18  5:46 AM  Result Value Ref Range   Glucose-Capillary 114 (H) 70 - 99 mg/dL    Blood Alcohol level:  Lab Results  Component Value Date   ETH <10 02/58/5277    Metabolic Disorder Labs: Lab Results  Component Value Date   HGBA1C 5.8 (H) 01/14/2018   MPG 119.76 01/14/2018   Lab Results  Component Value Date   PROLACTIN 53.4 (H) 01/14/2018   Lab Results  Component Value Date   CHOL 175 01/14/2018   TRIG 64 01/14/2018   HDL 71 01/14/2018   CHOLHDL 2.5 01/14/2018   VLDL 13 01/14/2018   LDLCALC 91 01/14/2018   LDLCALC 102 (H) 08/23/2017    Physical Findings: AIMS: Facial and Oral Movements Muscles of Facial Expression: None, normal Lips and Perioral Area: None, normal Jaw: None, normal Tongue: None, normal,Extremity Movements Upper (arms, wrists, hands, fingers): None, normal Lower (legs, knees, ankles, toes): None, normal, Trunk Movements Neck, shoulders, hips: None, normal, Overall Severity Severity of abnormal movements (highest score from questions above): None, normal Incapacitation due to abnormal movements: None, normal Patient's awareness of abnormal movements (rate only patient's report): No Awareness, Dental Status Current problems with teeth and/or dentures?: No Does patient usually wear dentures?: No  CIWA:    COWS:     Musculoskeletal: Strength & Muscle Tone: within normal limits Gait & Station: normal Patient leans: N/A  Psychiatric Specialty Exam: Physical Exam  ROS  Blood pressure 126/83, pulse 88, temperature 98 F (36.7 C), temperature source Oral,  resp. rate 18, height 5' (1.524 m), weight 73.9 kg.Body mass index is 31.83 kg/m.  General Appearance: Casual  Eye Contact:  Good  Speech:  Clear and Coherent  Volume:  Normal  Mood:  Anxious and Depressed  Affect:  Depressed  Thought Process:  Coherent  Orientation:  Full (Time, Place, and Person)  Thought Content:  Obsessions  Suicidal Thoughts:  No  Homicidal Thoughts:  No  Memory:  Immediate;   Fair  Judgement:  Good  Insight:  Good  Psychomotor Activity:  Normal  Concentration:  Concentration: Good  Recall:  Good  Fund of Knowledge:  Good  Language:  Good  Akathisia:  Negative  Handed:  Right  AIMS (if indicated):     Assets:  Communication Skills  ADL's:  Intact  Cognition:  WNL  Sleep:  Number of Hours: 6.25    Change meds today to address OCD Add NAC at d- cina m  cog 1-1 Treatment Plan Summary: Daily contact with patient to assess and evaluate symptoms and  progress in treatment and Medication management  [name], MD 01/15/2018, 8:09 AM

## 2018-01-15 NOTE — Tx Team (Signed)
Interdisciplinary Treatment and Diagnostic Plan Update  01/15/2018 Time of Session: 0925 Vickie Marshall MRN: [id number]  Principal Diagnosis: MDD (major depressive disorder), recurrent, severe, with psychosis ([place])  Secondary Diagnoses: Principal Problem:   MDD (major depressive disorder), recurrent, severe, with psychosis ([name])   Current Medications:  Current Facility-Administered Medications  Medication Dose Route Frequency Provider Last Rate Last Dose  . acetaminophen (TYLENOL) tablet 650 mg  650 mg Oral Q6H PRN Derrill Center, NP      . alum & mag hydroxide-simeth (MAALOX/MYLANTA) 200-200-20 MG/5ML suspension 30 mL  30 mL Oral Q4H PRN Derrill Center, NP      . busPIRone (BUSPAR) tablet 10 mg  10 mg Oral TID [name], MD   10 mg at 01/15/18 1320  . FLUoxetine (PROZAC) capsule 20 mg  20 mg Oral Daily [name], MD   20 mg at 01/15/18 1029  . hydrochlorothiazide (HYDRODIURIL) tablet 25 mg  25 mg Oral Daily Derrill Center, NP   25 mg at 01/15/18 0737  . lisinopril (PRINIVIL,ZESTRIL) tablet 20 mg  20 mg Oral Daily Derrill Center, NP   20 mg at 01/15/18 0738  . loratadine (CLARITIN) tablet 10 mg  10 mg Oral Daily Derrill Center, NP   10 mg at 01/15/18 0739  . magnesium hydroxide (MILK OF MAGNESIA) suspension 30 mL  30 mL Oral Daily PRN Derrill Center, NP      . metFORMIN (GLUCOPHAGE) tablet 500 mg  500 mg Oral BID WC Derrill Center, NP   500 mg at 01/15/18 0739  . traZODone (DESYREL) tablet 50 mg  50 mg Oral QHS PRN Derrill Center, NP   50 mg at 01/14/18 0024   PTA Medications: Medications Prior to Admission  Medication Sig Dispense Refill Last Dose  . cetirizine (ZYRTEC) 10 MG tablet Take 10 mg by mouth daily.   Past Week at Unknown time  . lisinopril-hydrochlorothiazide (PRINZIDE,ZESTORETIC) 20-25 MG tablet Take 1 tablet by mouth daily. 30 tablet 1 01/12/2018 at Unknown time  . metFORMIN (GLUCOPHAGE) 500 MG tablet Take 1 tablet (500 mg total) by mouth daily with  breakfast. 90 tablet 3 01/12/2018 at Unknown time  . Multiple Vitamin (MULTIVITAMIN) tablet Take 1 tablet by mouth daily.   Past Week at Unknown time  . sulfamethoxazole-trimethoprim (BACTRIM DS,SEPTRA DS) 800-160 MG tablet Take 1 tablet by mouth 2 (two) times daily. (Patient not taking: Reported on 01/13/2018) 20 tablet 0 Completed Course at Unknown time    Patient Stressors: Financial difficulties Marital or family conflict Medication change or noncompliance Occupational concerns  Patient Strengths: Ability for insight Average or above average intelligence General fund of knowledge Motivation for treatment/growth  Treatment Modalities: Medication Management, Group therapy, Case management,  1 to 1 session with clinician, Psychoeducation, Recreational therapy.   Physician Treatment Plan for Primary Diagnosis: MDD (major depressive disorder), recurrent, severe, with psychosis ([name]) Long Term Goal(s): Improvement in symptoms so as ready for discharge Improvement in symptoms so as ready for discharge   Short Term Goals: Ability to identify changes in lifestyle to reduce recurrence of condition will improve Ability to demonstrate self-control will improve Ability to maintain clinical measurements within normal limits will improve Ability to identify changes in lifestyle to reduce recurrence of condition will improve Ability to verbalize feelings will improve Ability to maintain clinical measurements within normal limits will improve Compliance with prescribed medications will improve  Medication Management: Evaluate patient's response, side effects, and tolerance of medication regimen.  Therapeutic Interventions: 1 to 1 sessions, Unit Group sessions and Medication administration.  Evaluation of Outcomes: Progressing  Physician Treatment Plan for Secondary Diagnosis: Principal Problem:   MDD (major depressive disorder), recurrent, severe, with psychosis ([name])  Long Term Goal(s):  Improvement in symptoms so as ready for discharge Improvement in symptoms so as ready for discharge   Short Term Goals: Ability to identify changes in lifestyle to reduce recurrence of condition will improve Ability to demonstrate self-control will improve Ability to maintain clinical measurements within normal limits will improve Ability to identify changes in lifestyle to reduce recurrence of condition will improve Ability to verbalize feelings will improve Ability to maintain clinical measurements within normal limits will improve Compliance with prescribed medications will improve     Medication Management: Evaluate patient's response, side effects, and tolerance of medication regimen.  Therapeutic Interventions: 1 to 1 sessions, Unit Group sessions and Medication administration.  Evaluation of Outcomes: Progressing   RN Treatment Plan for Primary Diagnosis: MDD (major depressive disorder), recurrent, severe, with psychosis ([name]) Long Term Goal(s): Knowledge of disease and therapeutic regimen to maintain health will improve  Short Term Goals: Ability to identify and develop effective coping behaviors will improve and Compliance with prescribed medications will improve  Medication Management: RN will administer medications as ordered by provider, will assess and evaluate patient's response and provide education to patient for prescribed medication. RN will report any adverse and/or side effects to prescribing provider.  Therapeutic Interventions: 1 on 1 counseling sessions, Psychoeducation, Medication administration, Evaluate responses to treatment, Monitor vital signs and CBGs as ordered, Perform/monitor CIWA, COWS, AIMS and Fall Risk screenings as ordered, Perform wound care treatments as ordered.  Evaluation of Outcomes: Progressing   LCSW Treatment Plan for Primary Diagnosis: MDD (major depressive disorder), recurrent, severe, with psychosis ([name]) Long Term Goal(s): Safe  transition to appropriate next level of care at discharge, Engage patient in therapeutic group addressing interpersonal concerns.  Short Term Goals: Engage patient in aftercare planning with referrals and resources, Increase social support and Increase skills for wellness and recovery  Therapeutic Interventions: Assess for all discharge needs, 1 to 1 time with Social worker, Explore available resources and support systems, Assess for adequacy in community support network, Educate family and significant other(s) on suicide prevention, Complete Psychosocial Assessment, Interpersonal group therapy.  Evaluation of Outcomes: Not Met   Progress in Treatment: Attending groups: Yes. Participating in groups: Yes. Taking medication as prescribed: Yes. Toleration medication: Yes. Family/Significant other contact made: No, will contact:  when given permission Patient understands diagnosis: Yes. Discussing patient identified problems/goals with staff: Yes. Medical problems stabilized or resolved: Yes. Denies suicidal/homicidal ideation: Yes. Issues/concerns per patient self-inventory: No. Other: none  New problem(s) identified: No, Describe:  none  New Short Term/Long Term Goal(s):  Patient Goals:  "to stop the thoughts in my head"  Discharge Plan or Barriers:   Reason for Continuation of Hospitalization: Depression Hallucinations Medication stabilization  Estimated Length of Stay: 2-4 days.  Attendees: Patient: Vickie Marshall 01/15/2018   Physician:  01/15/2018   Nursing: Neldon Newport, RN 01/15/2018   RN Care Manager: 01/15/2018   Social Worker: Lurline Idol, LCSW 01/15/2018   Recreational Therapist:  01/15/2018   Other: Ricky Ala, NP 01/15/2018   Other:  01/15/2018   Other: 01/15/2018          Scribe for Treatment Team: Joanne Chars, LCSW 01/15/2018 1:25 PM

## 2018-01-15 NOTE — Progress Notes (Signed)
Recreation Therapy Notes  Date: 11.11.19 Time: 1000 Location: 500 Hall Dayroom  Group Topic: Communication, Team Building, Problem Solving  Goal Area(s) Addresses:  Patient will effectively work with peer towards shared goal.  Patient will identify skill used to make activity successful.  Patient will identify how skills used during activity can be used to reach post d/c goals.   Intervention: STEM Activity   Activity: [employer]. In teams, patients were asked to build the tallest freestanding tower possible out of 15 pipe cleaners. Systematically resources were removed, for example patient ability to use both hands and patient ability to verbally communicate.    Education: Education officer, community, Dentist.   Education Outcome: Acknowledges education/In group clarification offered/Needs additional education.   Clinical Observations/Feedback: Pt did not attend group.     Victorino Sparrow, LRT/CTRS         Ria Comment, Hurley Sobel A 01/15/2018 11:22 AM

## 2018-01-15 NOTE — Progress Notes (Addendum)
Nursing Progress Note: 7p-7a D: Pt currently presents with a pleasant affect and behavior. Pt states "I feel good today. I think I can sleep without the help of sleep aids." Interacting appropriately with the milieu. Pt reports good sleep during the previous night with current medication regimen. Pt did attend wrap-up group.  A: Pt's labs and vitals were monitored throughout the night. Pt supported emotionally and encouraged to express concerns and questions. Pt educated on medications.  R: Pt's safety ensured with 15 minute and environmental checks. Pt currently denies SI, HI, and AVH. Pt verbally contracts to seek staff if SI,HI, or AVH occurs and to consult with staff before acting on any harmful thoughts. Will continue to monitor.

## 2018-01-15 NOTE — Progress Notes (Signed)
Interdisciplinary Treatment Plan Update (Adult)  Date:  01/15/2018 Time Reviewed:  9:58 AM  Progress in Treatment: Attending groups: Yes. Participating in groups:  Yes. Taking medication as prescribed:  Yes. Tolerating medication:  Yes. Family/Significant othe contact made:   Patient understands diagnosis:  Yes. Discussing patient identified problems/goals with staff:  Yes. Medical problems stabilized or resolved: Ongoing treatment and management Denies suicidal/homicidal ideation: Yes. Issues/concerns per patient self-inventory:  No. Other:  New problem(s) identified: Yes, Describe:  Reports her goal is to continue taken medications as prescribed- reports she is hopeful that the medication will help with adutory hallunications  Discharge Plan or Barriers:  Reason for Continuation of Hospitalization: Anxiety Hallucinations Other; describe Depression  Comments:  Estimated length of stay: 5-7 days   New goal(s):   Review of initial/current patient goals per problem list:   1.  Goal(s):  Met:  No ongoing treatment   Target date: by  anticipated discharge date  As evidenced by: Mood stabilization and improvement  2.  Goal (s):  Met:  No  Target date:  anticipated discharge date  As evidenced by: Compliance with medication     Attendees: Patient:  Vickie Marshall 11/11/20199:58 AM     11/11/20199:58 AM  Physician:  MD Sheppard Evens  11/11/20199:58 AM  Nursing:   Candiss Norse  11/11/20199:58 AM  Case Manager: Winferd Humphrey  11/11/20199:58 AM   11/11/20199:58 AM  Other:   Ricky Ala  Nurse Practitioner  11/11/20199:58 AM   Scribe for Treatment Team:   Derrill Center, 01/15/2018, 9:58 AM

## 2018-01-16 LAB — GLUCOSE, CAPILLARY: GLUCOSE-CAPILLARY: 79 mg/dL (ref 70–99)

## 2018-01-16 MED ORDER — BUSPIRONE HCL 15 MG PO TABS
15.0000 mg | ORAL_TABLET | Freq: Three times a day (TID) | ORAL | Status: DC
  Administered 2018-01-16 – 2018-01-17 (×4): 15 mg via ORAL
  Filled 2018-01-16 (×6): qty 1

## 2018-01-16 NOTE — [facility] Group Notes (Signed)
[place] LCSW Group Therapy Note  Date/Time: 01/16/18, 1100  Type of Therapy and Topic:  Group Therapy:  Overcoming Obstacles  Participation Level:  active  Description of Group:    In this group patients will be encouraged to explore what they see as obstacles to their own wellness and recovery. They will be guided to discuss their thoughts, feelings, and behaviors related to these obstacles. The group will process together ways to cope with barriers, with attention given to specific choices patients can make. Each patient will be challenged to identify changes they are motivated to make in order to overcome their obstacles. This group will be process-oriented, with patients participating in exploration of their own experiences as well as giving and receiving support and challenge from other group members.  Therapeutic Goals: 1. Patient will identify personal and current obstacles as they relate to admission. 2. Patient will identify barriers that currently interfere with their wellness or overcoming obstacles.  3. Patient will identify feelings, thought process and behaviors related to these barriers. 4. Patient will identify two changes they are willing to make to overcome these obstacles:    Summary of Patient Progress: Pt shared that mental health and staying on the right path have been obstacles for her.  Pt active in group discussion today regarding ways to overcome these and mental health related obstacles.       Therapeutic Modalities:   Cognitive Behavioral Therapy Solution Focused Therapy Motivational Interviewing Relapse Prevention Therapy  Lurline Idol, LCSW

## 2018-01-16 NOTE — Progress Notes (Signed)
[facility] MD Progress Note  01/16/2018 7:56 AM Vickie Marshall  MRN:  [id number] Subjective:   Patient reports that she had minimal obsessional thoughts last night believe the medications are helpful, again she seems to have an OCD driven depressive disorder due to intrusive words and thoughts that are atypical and unpleasant/ Occasions are discussed she requests 1 more day to stabilize and be safe to go home Principal Problem: MDD (major depressive disorder), recurrent, severe, with psychosis ([place]) Diagnosis: As above, as well as obsessive-compulsive disorder Patient Active Problem List   Diagnosis Date Noted  . MDD (major depressive disorder), recurrent, severe, with psychosis ([place]) [F33.3] 01/13/2018  . Suicidal ideations [R45.851] 05/11/2017  . 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 Moderate episode of recurrent major depressive disorder ([place]) [F33.1] 05/11/2017   Total Time spent with patient: 30 minutes  Past Psychiatric History: no new info  Past Medical History:  Past Medical History:  Diagnosis Date  . Anxiety   . Diabetes mellitus without complication ([place])   . Hypertension   . Urinary tract infection 08/2017    Past Surgical History:  Procedure Laterality Date  . CHOLECYSTECTOMY     Family History:  Family History  Problem Relation Age of Onset  . Diabetes Mother   . Arthritis Mother   . Heart disease Mother   . Cancer Mother   . Hyperlipidemia Mother   . COPD Father     Social History:  Social History   Substance and Sexual Activity  Alcohol Use Yes     Social History   Substance and Sexual Activity  Drug Use No    Social History   Socioeconomic History  . Marital status: Single    Spouse name: Not on file  . Number of children: Not on file  . Years of education: Not on file  . Highest education level: Not on file  Occupational History  . Not on file  Social Needs  . Financial resource strain: Not on file  . Food insecurity:   Worry: Not on file    Inability: Not on file  . Transportation needs:    Medical: Not on file    Non-medical: Not on file  Tobacco Use  . Smoking status: Never Smoker  . Smokeless tobacco: Never Used  Substance and Sexual Activity  . Alcohol use: Yes  . Drug use: No  . Sexual activity: Not Currently  Lifestyle  . Physical activity:    Days per week: Not on file    Minutes per session: Not on file  . Stress: Not on file  Relationships  . Social connections:    Talks on phone: Not on file    Gets together: Not on file    Attends religious service: Not on file    Active member of club or organization: Not on file    Attends meetings of clubs or organizations: Not on file    Relationship status: Not on file  Other Topics Concern  . Not on file  Social History Narrative  . Not on file   Additional Social History:                         Sleep: Good  Appetite:  Good  Current Medications: Current Facility-Administered Medications  Medication Dose Route Frequency Provider Last Rate Last Dose  . acetaminophen (TYLENOL) tablet 650 mg  650 mg Oral Q6H PRN Derrill Center, NP      .  alum & mag hydroxide-simeth (MAALOX/MYLANTA) 200-200-20 MG/5ML suspension 30 mL  30 mL Oral Q4H PRN Derrill Center, NP      . busPIRone (BUSPAR) tablet 15 mg  15 mg Oral TID [name], MD      . FLUoxetine (PROZAC) capsule 20 mg  20 mg Oral Daily [name], MD   20 mg at 01/16/18 0740  . hydrochlorothiazide (HYDRODIURIL) tablet 25 mg  25 mg Oral Daily Derrill Center, NP   25 mg at 01/16/18 0740  . lisinopril (PRINIVIL,ZESTRIL) tablet 20 mg  20 mg Oral Daily Derrill Center, NP   20 mg at 01/16/18 0741  . loratadine (CLARITIN) tablet 10 mg  10 mg Oral Daily Derrill Center, NP   10 mg at 01/16/18 0740  . magnesium hydroxide (MILK OF MAGNESIA) suspension 30 mL  30 mL Oral Daily PRN Derrill Center, NP      . metFORMIN (GLUCOPHAGE) tablet 500 mg  500 mg Oral BID WC Derrill Center, NP    500 mg at 01/16/18 0741  . traZODone (DESYREL) tablet 50 mg  50 mg Oral QHS PRN Derrill Center, NP   50 mg at 01/15/18 2234    Lab Results:  Results for orders placed or performed during the hospital encounter of 01/13/18 (from the past 48 hour(s))  Glucose, capillary     Status: None   Collection Time: 01/14/18  9:21 AM  Result Value Ref Range   Glucose-Capillary 96 70 - 99 mg/dL  Glucose, capillary     Status: Abnormal   Collection Time: 01/14/18  4:40 PM  Result Value Ref Range   Glucose-Capillary 110 (H) 70 - 99 mg/dL  Glucose, capillary     Status: Abnormal   Collection Time: 01/15/18  5:46 AM  Result Value Ref Range   Glucose-Capillary 114 (H) 70 - 99 mg/dL  Glucose, capillary     Status: None   Collection Time: 01/16/18  6:27 AM  Result Value Ref Range   Glucose-Capillary 79 70 - 99 mg/dL    Blood Alcohol level:  Lab Results  Component Value Date   ETH <10 20/25/4270    Metabolic Disorder Labs: Lab Results  Component Value Date   HGBA1C 5.8 (H) 01/14/2018   MPG 119.76 01/14/2018   Lab Results  Component Value Date   PROLACTIN 53.4 (H) 01/14/2018   Lab Results  Component Value Date   CHOL 175 01/14/2018   TRIG 64 01/14/2018   HDL 71 01/14/2018   CHOLHDL 2.5 01/14/2018   VLDL 13 01/14/2018   LDLCALC 91 01/14/2018   LDLCALC 102 (H) 08/23/2017    Physical Findings: AIMS: Facial and Oral Movements Muscles of Facial Expression: None, normal Lips and Perioral Area: None, normal Jaw: None, normal Tongue: None, normal,Extremity Movements Upper (arms, wrists, hands, fingers): None, normal Lower (legs, knees, ankles, toes): None, normal, Trunk Movements Neck, shoulders, hips: None, normal, Overall Severity Severity of abnormal movements (highest score from questions above): None, normal Incapacitation due to abnormal movements: None, normal Patient's awareness of abnormal movements (rate only patient's report): No Awareness, Dental Status Current problems  with teeth and/or dentures?: No Does patient usually wear dentures?: No  CIWA:    COWS:     Musculoskeletal: Strength & Muscle Tone: within normal limits Gait & Station: normal Patient leans: N/A  Psychiatric Specialty Exam: Physical Exam  ROS  Blood pressure 116/85, pulse 79, temperature 97.8 F (36.6 C), temperature source Oral, resp. rate 18, height 5' (  1.524 m), weight 73.9 kg.Body mass index is 31.83 kg/m.  General Appearance: Casual  Eye Contact:  Good  Speech:  Clear and Coherent  Volume:  Normal  Mood:  Dysphoric  Affect:  Congruent  Thought Process:  Coherent  Orientation:  Full (Time, Place, and Person)  Thought Content:  Obsessions  Suicidal Thoughts:  No  Homicidal Thoughts:  No  Memory:  Immediate;   Good  Judgement:  Good  Insight:  Good  Psychomotor Activity:  Normal  Concentration:  Concentration: Good  Recall:  Good  Fund of Knowledge:  Good  Language:  Good  Akathisia:  Negative  Handed:  Right  AIMS (if indicated):     Assets:  Communication Skills  ADL's:  Intact  Cognition:  WNL  Sleep:  Number of Hours: 6.5     Treatment Plan Summary: Showing improvement in her plans are to discharge tomorrow with minor med adjustments today Daily contact with patient to assess and evaluate symptoms and progress in treatment and Medication management  Amiylah Anastos, MD 01/16/2018, 7:56 AM

## 2018-01-16 NOTE — Progress Notes (Signed)
Adult Psychoeducational Group Note  Date:  01/16/2018 Time:  12:03 PM  Group Topic/Focus:  Goals Group:   The focus of this group is to help patients establish daily goals to achieve during treatment and discuss how the patient can incorporate goal setting into their daily lives to aide in recovery.  Participation Level:  Active  Participation Quality:  Appropriate  Affect:  Appropriate  Cognitive:  Appropriate  Insight: Appropriate  Engagement in Group:  Engaged  Modes of Intervention:  Discussion  Additional Comments:  Pt attended morning goals group.   Vickie Marshall 01/16/2018, 12:03 PM

## 2018-01-16 NOTE — Progress Notes (Signed)
D:  Vickie Marshall was up and visible on the unit.  She denied SI/HI or A/V hallucinations.  She did report that her racing thoughts/voices were better and that she is sleeping better at night.  She did report that she is going to be leaving tomorrow and is apprehensive about going to the [employer].  Assisted her with looking up information about [facility] rescue mission and she was glad that she did get some information.  She was tearful talking about family not allowing her to live with them so "I guess going to [facility] would be good to get away from my family."  She took trazodone to sleep this evening and appears to be asleep. A:  1:1 with RN for support and encouragement.  Medications as ordered.  Q 15 minute checks maintained for safety.  Encouraged participation in group and unit activities.   R:  Vickie Marshall remains safe on the unit.  We will continue to monitor the progress towards her goals.

## 2018-01-16 NOTE — Progress Notes (Signed)
Patient denies SI, HI and AVH.  Patient reports a decrease in ruminating thoughts.  Patient has attended groups and has had no incidents of behavioral dyscontrol.  Continue to monitor as planned.

## 2018-01-16 NOTE — Progress Notes (Signed)
Recreation Therapy Notes  Date: 11.12.19 Time: 1000 Location: 500 Hall Dayroom  Group Topic: Wellness  Goal Area(s) Addresses:  Patient will define components of whole wellness. Patient will verbalize benefit of whole wellness.  Behavioral Response: Engaged  Intervention: Music, Exercise  Activity: Exercise.  LRT lead group in a series of exercises.  Patients then had to opportunity to lead the group in an exercise of their choice.  The group was to complete at least 30 minutes of exercise with rest and water breaks throughout as needed.  Education: Wellness, Dentist.   Education Outcome: Acknowledges education/In group clarification offered/Needs additional education.   Clinical Observations/Feedback: Pt was bright and engaged.  Pt lead group in stretches as well as various exercises.  Pt was active and on task.  Pt was also social with her peers.  Pt was also talking to her peer about healthy eating habits.     Victorino Sparrow, LRT/CTRS     Victorino Sparrow A 01/16/2018 11:23 AM

## 2018-01-16 NOTE — Plan of Care (Signed)
  Problem: Activity: Goal: Sleeping patterns will improve Outcome: Completed/Met Note:  Pt reports sleeping better with trazodone

## 2018-01-17 LAB — GLUCOSE, CAPILLARY: GLUCOSE-CAPILLARY: 80 mg/dL (ref 70–99)

## 2018-01-17 MED ORDER — METFORMIN HCL 500 MG PO TABS: 500.0000 mg | ORAL_TABLET | Freq: Two times a day (BID) | ORAL | 0 refills | Status: AC

## 2018-01-17 MED ORDER — TRAZODONE HCL 50 MG PO TABS: 50.0000 mg | ORAL_TABLET | Freq: Every evening | ORAL | 0 refills | Status: DC | PRN

## 2018-01-17 MED ORDER — BUSPIRONE HCL 15 MG PO TABS: 15.0000 mg | ORAL_TABLET | Freq: Three times a day (TID) | ORAL | 0 refills | Status: DC

## 2018-01-17 MED ORDER — LISINOPRIL-HYDROCHLOROTHIAZIDE 20-25 MG PO TABS: 1.0000 | ORAL_TABLET | Freq: Every day | ORAL | 0 refills | Status: DC

## 2018-01-17 MED ORDER — CETIRIZINE HCL 10 MG PO TABS: 10.0000 mg | ORAL_TABLET | Freq: Every day | ORAL | Status: AC

## 2018-01-17 MED ORDER — FLUOXETINE HCL 20 MG PO CAPS: 20.0000 mg | ORAL_CAPSULE | Freq: Every day | ORAL | 0 refills | Status: DC

## 2018-01-17 NOTE — Progress Notes (Signed)
Recreation Therapy Notes  INPATIENT RECREATION TR PLAN  Patient Details Name: Vickie Marshall MRN: [id number] DOB: Sep 01, 1965 Today's Date: 01/17/2018  Rec Therapy Plan Is patient appropriate for Therapeutic Recreation?: Yes Treatment times per week: about 3 days Estimated Length of Stay: 5-7 days TR Treatment/Interventions: Group participation (Comment)  Discharge Criteria Pt will be discharged from therapy if:: Discharged Treatment plan/goals/alternatives discussed and agreed upon by:: Patient/family  Discharge Summary Short term goals set: See patient care plan Short term goals met: Complete Progress toward goals comments: Groups attended Which groups?: Wellness Reason goals not met: None Therapeutic equipment acquired: N/A Reason patient discharged from therapy: Discharge from hospital Pt/family agrees with progress & goals achieved: Yes Date patient discharged from therapy: 01/17/18    Victorino Sparrow, LRT/CTRS  Ria Comment, Shamarcus Hoheisel A 01/17/2018, 12:40 PM

## 2018-01-17 NOTE — Progress Notes (Signed)
  [facility] Adult Case Management Discharge Plan :  Will you be returning to the same living situation after discharge:  No. Will be going to [facility]. At discharge, do you have transportation home?: No. Bus passes to [employer] provided. Do you have the ability to pay for your medications: No. Will work with [place] [facility].  Release of information consent forms completed and in the chart;  Patient's signature needed at discharge.  Patient to Follow up at: Follow-up Information    [facility]. Go in 3 day(s).   Why:  Please attend a walk in appt Monday-Friday at 8:30am.  Please request therapy and medication management services. Contact information: [address] [place], [place] [address] P: [phone] F: [place]. Go on 01/17/2018.   Why:  Go today to the [facility] and request admission. Contact information: [place], [place] [address] P: [phone]             Next level of care provider has access to [place] and Suicide Prevention discussed: No. Pt declined.  Have you used any form of tobacco in the last 30 days? (Cigarettes, Smokeless Tobacco, Cigars, and/or Pipes): No  Has patient been referred to the Quitline?: N/A patient is not a smoker  Patient has been referred for addiction treatment: N/A  Joanne Chars, LCSW 01/17/2018, 10:24 AM

## 2018-01-17 NOTE — Discharge Summary (Signed)
Physician Discharge Summary Note  Patient:  Vickie Marshall is an 52 y.o., female  MRN:  [id number]  DOB:  Mar 05, 1966  Patient phone:  [phone] (home)   Patient address:   [place] [address],   Total Time spent with patient: Greater than 30 minutes  Date of Admission:  01/13/2018  Date of Discharge: 01-17-18  Reason for Admission: Worsening depression, suicidal ideations & hallucinations.  Principal Problem: MDD (major depressive disorder), recurrent, severe, with psychosis [facility])  Discharge Diagnoses: Patient Active Problem List   Diagnosis Date Noted  . MDD (major depressive disorder), recurrent, severe, with psychosis ([place]) [F33.3] 01/13/2018  . Suicidal ideations [R45.851] 05/11/2017  . 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 Moderate episode of recurrent major depressive disorder ([place]) [F33.1] 05/11/2017   Past Psychiatric History: Major depressive disorder with psychosis  Past Medical History:  Past Medical History:  Diagnosis Date  . Anxiety   . Diabetes mellitus without complication ([place])   . Hypertension   . Urinary tract infection 08/2017    Past Surgical History:  Procedure Laterality Date  . CHOLECYSTECTOMY     Family History:  Family History  Problem Relation Age of Onset  . Diabetes Mother   . Arthritis Mother   . Heart disease Mother   . Cancer Mother   . Hyperlipidemia Mother   . COPD Father    Family Psychiatric  History: See H&P  Social History:  Social History   Substance and Sexual Activity  Alcohol Use Yes     Social History   Substance and Sexual Activity  Drug Use No    Social History   Socioeconomic History  . Marital status: Single    Spouse name: Not on file  . Number of children: Not on file  . Years of education: Not on file  . Highest education level: Not on file  Occupational History  . Not on file  Social Needs  . Financial resource strain: Not on file  . Food  insecurity:    Worry: Not on file    Inability: Not on file  . Transportation needs:    Medical: Not on file    Non-medical: Not on file  Tobacco Use  . Smoking status: Never Smoker  . Smokeless tobacco: Never Used  Substance and Sexual Activity  . Alcohol use: Yes  . Drug use: No  . Sexual activity: Not Currently  Lifestyle  . Physical activity:    Days per week: Not on file    Minutes per session: Not on file  . Stress: Not on file  Relationships  . Social connections:    Talks on phone: Not on file    Gets together: Not on file    Attends religious service: Not on file    Active member of club or organization: Not on file    Attends meetings of clubs or organizations: Not on file    Relationship status: Not on file  Other Topics Concern  . Not on file  Social History Narrative  . Not on file   Hospital Course: (Per Md's admission evaluation): Vickie M McCoyis an 52 y.o.femalewho presents voluntarilyaccompanied by reporting primary symptoms of depression, SI and hallucinations. Pt states that she almost cut wrists in a suicide attempt a few days ago and reports that she has been watching You Tube videos to get ideas of how to kill herself. She states that she hears command hallucinations, and she holds her fingers in her  ears to try to stop them, but that isn't working anymore.Pt acknowledges symptoms including crying spells, social withdrawal, loss of interest in usual pleasures, decreased concentration, fatigue, irritability, decreased sleep, decreased appetite and feelings of hopelessness. Pt endorses SI, HIyesterday (wanted to "choke the life out of a lady at the hotel--I could visualize it happening"), psychosis,deniesSA. PT describesnopast attempts,denieshistory of violence.Pt states that symptomshave been worsening in the past few weeks.  Evaluation: Vickie Marshall is awake alert and oriented x3.  Reports recently she feels like life has become too overwhelming for her to  handle.  Reports she is followed by her primary care provider who recently adjusted a few of her medications for depression.  States she was advised to come to get evaluated at the local emergency department.  She reports she was feeling suicidal and homicidal admission to the emergency department.  Reports hearing voices that are chronic in nature.  After the above admission assessment, Vickie Marshall was started on the medication regimen for her presenting symptoms. She was stabilized & discharged on the following medications, Buspar 15 mg for anxiety, Fluoxetine 20 mg for depression & Trazodone 50 mg prn for insomnia. She was enrolled & participated in the group counseling sessions being offered & held on this unit. She learned coping skills. She presented other significant pre-existing medical issues that required treatment . She was resumed & discharged on her pertinent home medications for those health issues. She tolerated her treatment regimen without any adverse effects or reactions reported.  During the course of her hospitalization, Vickie Marshall was evaluated each day by a clinical provider to ascertain her response to her treatment regimen. The medication changes & adjustments were made according to her need. As the day goes by, improvement was noted as evidence by her report of decreasing symptoms, improved affect, medication tolerance, behavior & participation in the unit programming. Her symptoms responded well to her treatment regimen. Also being in a therapeutic and supportive environment assisted in her mood stability.   Upon discharge, Vickie Marshall denies SI/HI and voiced no AVH. She was motivated to continue taking medication with a goal of continued improvement in mental health.  She is discharged to follow-up care for medication management & routine psychiatric care on an outpatient basis as noted below. She is provided with all the necessary information needed to make this appointment without problems. She  left [facility] in no apparent distress with all personal belongings.  Physical Findings: AIMS: Facial and Oral Movements Muscles of Facial Expression: None, normal Lips and Perioral Area: None, normal Jaw: None, normal Tongue: None, normal,Extremity Movements Upper (arms, wrists, hands, fingers): None, normal Lower (legs, knees, ankles, toes): None, normal, Trunk Movements Neck, shoulders, hips: None, normal, Overall Severity Severity of abnormal movements (highest score from questions above): None, normal Incapacitation due to abnormal movements: None, normal Patient's awareness of abnormal movements (rate only patient's report): No Awareness, Dental Status Current problems with teeth and/or dentures?: No Does patient usually wear dentures?: No  CIWA:    COWS:     Musculoskeletal: Strength & Muscle Tone: within normal limits Gait & Station: normal Patient leans: N/A  Psychiatric Specialty Exam: Physical Exam  Nursing note and vitals reviewed. Constitutional: She is oriented to person, place, and time. She appears well-developed.  HENT:  Head: Normocephalic.  Eyes: Pupils are equal, round, and reactive to light.  Neck: Normal range of motion.  Cardiovascular: Normal rate.  Respiratory: Effort normal.  GI: Soft.  Genitourinary:  Genitourinary Comments: Deferred  Musculoskeletal: Normal range of  motion.  Neurological: She is alert and oriented to person, place, and time.  Skin: Skin is warm.    Review of Systems  Constitutional: Negative.   HENT: Negative.   Eyes: Negative.   Respiratory: Negative for cough.   Cardiovascular: Negative for chest pain and palpitations.  Gastrointestinal: Negative.  Negative for heartburn, nausea and vomiting.  Skin: Negative.   Neurological: Negative.  Negative for dizziness.  Endo/Heme/Allergies: Negative.   Psychiatric/Behavioral: Positive for depression (Stable) and hallucinations (Hx. psychosis (Stable)). Negative for memory loss,  substance abuse and suicidal ideas. The patient has insomnia (Stable). The patient is not nervous/anxious.     Blood pressure 109/84, pulse 83, temperature (!) 97.5 F (36.4 C), temperature source Oral, resp. rate 20, height 5' (1.524 m), weight 73.9 kg.Body mass index is 31.83 kg/m.  See Md's discharge SRA   Have you used any form of tobacco in the last 30 days? (Cigarettes, Smokeless Tobacco, Cigars, and/or Pipes): No  Has this patient used any form of tobacco in the last 30 days? (Cigarettes, Smokeless Tobacco, Cigars, and/or Pipes): N/A  Blood Alcohol level:  Lab Results  Component Value Date   ETH <10 02/40/9735    Metabolic Disorder Labs:  Lab Results  Component Value Date   HGBA1C 5.8 (H) 01/14/2018   MPG 119.76 01/14/2018   Lab Results  Component Value Date   PROLACTIN 53.4 (H) 01/14/2018   Lab Results  Component Value Date   CHOL 175 01/14/2018   TRIG 64 01/14/2018   HDL 71 01/14/2018   CHOLHDL 2.5 01/14/2018   VLDL 13 01/14/2018   LDLCALC 91 01/14/2018   LDLCALC 102 (H) 08/23/2017    See Psychiatric Specialty Exam and Suicide Risk Assessment completed by Attending Physician prior to discharge.  Discharge destination:  Home  Is patient on multiple antipsychotic therapies at discharge:  No   Has Patient had three or more failed trials of antipsychotic monotherapy by history:  No  Recommended Plan for Multiple Antipsychotic Therapies: NA  Allergies as of 01/17/2018      Reactions   Asparagus Nausea Only   Codeine Nausea Only, Other (See Comments)   dizziness   Eggs Or Egg-derived Products Nausea Only      Medication List    STOP taking these medications   multivitamin tablet   sulfamethoxazole-trimethoprim 800-160 MG tablet Commonly known as:  BACTRIM DS,SEPTRA DS     TAKE these medications     Indication  busPIRone 15 MG tablet Commonly known as:  BUSPAR Take 1 tablet (15 mg total) by mouth 3 (three) times daily. For anxiety  Indication:   Anxiety Disorder   cetirizine 10 MG tablet Commonly known as:  ZYRTEC Take 1 tablet (10 mg total) by mouth daily. (May buy from over the counter): For allergies What changed:  additional instructions  Indication:  Perennial Allergic Rhinitis, Hayfever   FLUoxetine 20 MG capsule Commonly known as:  PROZAC Take 1 capsule (20 mg total) by mouth daily. For depression Start taking on:  01/18/2018  Indication:  Major Depressive Disorder   lisinopril-hydrochlorothiazide 20-25 MG tablet Commonly known as:  PRINZIDE,ZESTORETIC Take 1 tablet by mouth daily. For high blood pressure What changed:  additional instructions  Indication:  High Blood Pressure Disorder   metFORMIN 500 MG tablet Commonly known as:  GLUCOPHAGE Take 1 tablet (500 mg total) by mouth 2 (two) times daily with a meal. For diabetes management What changed:    when to take this  additional instructions  Indication:  Type 2 Diabetes   traZODone 50 MG tablet Commonly known as:  DESYREL Take 1 tablet (50 mg total) by mouth at bedtime as needed for sleep.  Indication:  [place]. Go in 3 day(s).   Why:  Please attend a walk in appt Monday-Friday at 8:30am.  Please request therapy and medication management services. Contact information: [address] [place], [place] [address] P: [phone] F: [place]. Go on 01/17/2018.   Why:  Go today to the [facility] and request admission. Contact information: [place], [place] [address] P: [phone]             Follow-up recommendations: Activity:  As tolerated Diet: As recommended by your primary care doctor. Keep all scheduled follow-up appointments as recommended.   Comments: Patient is instructed prior to discharge to: Take all medications as prescribed by his/her mental healthcare provider. Report any adverse effects and or reactions from  the medicines to his/her outpatient provider promptly. Patient has been instructed & cautioned: To not engage in alcohol and or illegal drug use while on prescription medicines. In the event of worsening symptoms, patient is instructed to call the crisis hotline, 911 and or go to the nearest ED for appropriate evaluation and treatment of symptoms. To follow-up with his/her primary care provider for your other medical issues, concerns and or health care needs.   Signed: Lindell Spar, NP, PMHNP, FNP-BC 01/17/2018, 3:05 PM

## 2018-01-17 NOTE — Plan of Care (Signed)
Pt was able to practice stress management techniques after meeting with LRT.   Victorino Sparrow, LRT/CTRS

## 2018-01-17 NOTE — Plan of Care (Signed)
Discharge Note  Patient verbalizes readiness for discharge. Follow up plan explained, AVS, Transition record and SRA given. Prescriptions and teaching provided. Belongings returned and signed for. Suicide safety plan completed and signed. Patient verbalizes understanding. Patient denies SI/HI and assures this Probation officer he will seek assistance should that change. Patient discharged to lobby where ride was waiting.  Problem: Education: Goal: Knowledge of [facility] Education information/materials will improve Outcome: Adequate for Discharge Goal: Emotional status will improve Outcome: Adequate for Discharge Goal: Mental status will improve Outcome: Adequate for Discharge Goal: Verbalization of understanding the information provided will improve Outcome: Adequate for Discharge   Problem: Activity: Goal: Interest or engagement in activities will improve Outcome: Adequate for Discharge   Problem: Coping: Goal: Ability to verbalize frustrations and anger appropriately will improve Outcome: Adequate for Discharge Goal: Ability to demonstrate self-control will improve Outcome: Adequate for Discharge   Problem: Health Behavior/Discharge Planning: Goal: Identification of resources available to assist in meeting health care needs will improve Outcome: Adequate for Discharge Goal: Compliance with treatment plan for underlying cause of condition will improve Outcome: Adequate for Discharge   Problem: Physical Regulation: Goal: Ability to maintain clinical measurements within normal limits will improve Outcome: Adequate for Discharge   Problem: Safety: Goal: Periods of time without injury will increase Outcome: Adequate for Discharge   Problem: Education: Goal: Knowledge of [place] [facility] Education information/materials will improve Outcome: Adequate for Discharge Goal: Emotional status will improve Outcome: Adequate for Discharge Goal: Mental status will improve Outcome:  Adequate for Discharge Goal: Verbalization of understanding the information provided will improve Outcome: Adequate for Discharge   Problem: Activity: Goal: Interest or engagement in activities will improve Outcome: Adequate for Discharge Goal: Sleeping patterns will improve Outcome: Adequate for Discharge   Problem: Coping: Goal: Ability to verbalize frustrations and anger appropriately will improve Outcome: Adequate for Discharge Goal: Ability to demonstrate self-control will improve Outcome: Adequate for Discharge   Problem: Health Behavior/Discharge Planning: Goal: Identification of resources available to assist in meeting health care needs will improve Outcome: Adequate for Discharge Goal: Compliance with treatment plan for underlying cause of condition will improve Outcome: Adequate for Discharge   Problem: Physical Regulation: Goal: Ability to maintain clinical measurements within normal limits will improve Outcome: Adequate for Discharge   Problem: Safety: Goal: Periods of time without injury will increase Outcome: Adequate for Discharge   Problem: Education: Goal: Utilization of techniques to improve thought processes will improve Outcome: Adequate for Discharge Goal: Knowledge of the prescribed therapeutic regimen will improve Outcome: Adequate for Discharge   Problem: Activity: Goal: Interest or engagement in leisure activities will improve Outcome: Adequate for Discharge Goal: Imbalance in normal sleep/wake cycle will improve Outcome: Adequate for Discharge   Problem: Coping: Goal: Coping ability will improve Outcome: Adequate for Discharge Goal: Will verbalize feelings Outcome: Adequate for Discharge   Problem: Health Behavior/Discharge Planning: Goal: Ability to make decisions will improve Outcome: Adequate for Discharge Goal: Compliance with therapeutic regimen will improve Outcome: Adequate for Discharge   Problem: Role Relationship: Goal: Will  demonstrate positive changes in social behaviors and relationships Outcome: Adequate for Discharge   Problem: Safety: Goal: Ability to disclose and discuss suicidal ideas will improve Outcome: Adequate for Discharge Goal: Ability to identify and utilize support systems that promote safety will improve Outcome: Adequate for Discharge   Problem: Self-Concept: Goal: Will verbalize positive feelings about self Outcome: Adequate for Discharge Goal: Level of anxiety will decrease Outcome: Adequate for Discharge   Problem: Education:  Goal: Ability to make informed decisions regarding treatment will improve Outcome: Adequate for Discharge   Problem: Coping: Goal: Coping ability will improve Outcome: Adequate for Discharge   Problem: Health Behavior/Discharge Planning: Goal: Identification of resources available to assist in meeting health care needs will improve Outcome: Adequate for Discharge   Problem: Medication: Goal: Compliance with prescribed medication regimen will improve Outcome: Adequate for Discharge   Problem: Self-Concept: Goal: Ability to disclose and discuss suicidal ideas will improve Outcome: Adequate for Discharge Goal: Will verbalize positive feelings about self Outcome: Adequate for Discharge   Problem: Activity: Goal: Will verbalize the importance of balancing activity with adequate rest periods Outcome: Adequate for Discharge   Problem: Education: Goal: Will be free of psychotic symptoms Outcome: Adequate for Discharge Goal: Knowledge of the prescribed therapeutic regimen will improve Outcome: Adequate for Discharge   Problem: Coping: Goal: Coping ability will improve Outcome: Adequate for Discharge Goal: Will verbalize feelings Outcome: Adequate for Discharge   Problem: Health Behavior/Discharge Planning: Goal: Compliance with prescribed medication regimen will improve Outcome: Adequate for Discharge   Problem: Nutritional: Goal: Ability to  achieve adequate nutritional intake will improve Outcome: Adequate for Discharge   Problem: Role Relationship: Goal: Ability to communicate needs accurately will improve Outcome: Adequate for Discharge Goal: Ability to interact with others will improve Outcome: Adequate for Discharge   Problem: Safety: Goal: Ability to redirect hostility and anger into socially appropriate behaviors will improve Outcome: Adequate for Discharge Goal: Ability to remain free from injury will improve Outcome: Adequate for Discharge   Problem: Self-Care: Goal: Ability to participate in self-care as condition permits will improve Outcome: Adequate for Discharge   Problem: Self-Concept: Goal: Will verbalize positive feelings about self Outcome: Adequate for Discharge

## 2018-01-17 NOTE — [facility] Suicide Risk Assessment (Signed)
[facility] Discharge Suicide Risk Assessment   Principal Problem: MDD (major depressive disorder), recurrent, severe, with psychosis ([place]) Discharge Diagnoses: same Patient Active Problem List   Diagnosis Date Noted  . MDD (major depressive disorder), recurrent, severe, with psychosis ([place]) [F33.3] 01/13/2018  . Suicidal ideations [R45.851] 05/11/2017  . 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 Moderate episode of recurrent major depressive disorder ([place]) [F33.1] 05/11/2017    Total Time spent with patient: 30 minutes  Musculoskeletal: Strength & Muscle Tone: within normal limits Gait & Station: normal Patient leans: N/A  Psychiatric Specialty Exam: ROS  Blood pressure 109/84, pulse 83, temperature (!) 97.5 F (36.4 C), temperature source Oral, resp. rate 20, height 5' (1.524 m), weight 73.9 kg.Body mass index is 31.83 kg/m.  General Appearance: Casual  Eye Contact::  Good  Speech:  Clear and Coherent409  Volume:  Normal  Mood:  Euthymic  Affect:  Congruent  Thought Process:  Coherent  Orientation:  Negative  Thought Content:  Logical  Suicidal Thoughts:  No  Homicidal Thoughts:  No  Memory:  Immediate;   Good  Judgement:  Good  Insight:  Good  Psychomotor Activity:  Normal  Concentration:  Fair  Recall:  [employer] of Knowledge:Fair  Language: Fair  Akathisia:  NA  Handed:  Right  AIMS (if indicated):     Assets:  Communication Skills  Sleep:  Number of Hours: 6  Cognition: WNL  ADL's:  Intact   Mental Status Per Nursing Assessment::   On Admission:  NA  Demographic Factors:  Low socioeconomic status  Loss Factors: Decrease in vocational status  Historical Factors: NA  Risk Reduction Factors:   NA  Continued Clinical Symptoms:  Depression:   Severe  Cognitive Features That Contribute To Risk:  None    Suicide Risk:  Minimal: No identifiable suicidal ideation.  Patients presenting with no risk factors but with morbid ruminations; may be  classified as minimal risk based on the severity of the depressive symptoms    Plan Of Care/Follow-up recommendations:  Activity:  full  Kashton Mcartor, MD 01/17/2018, 9:31 AM

## 2018-01-17 NOTE — Progress Notes (Signed)
Recreation Therapy Notes  11.13.19 1210:   LRT met with pt and gave pt a handout of about various stress management techniques.  LRT went over the techniques with pt and explained how each one worked.  Pt was receptive to information and attentive to the explanations.     Victorino Sparrow, LRT/CTRS    Victorino Sparrow A 01/17/2018 12:34 PM

## 2018-06-22 NOTE — Congregational Nurse Program (Signed)
Patient reports history of Hypertension, Diabetes, and Anxiety.  Vickie Mcalpine, LPN

## 2018-06-27 ENCOUNTER — Telehealth: Payer: Self-pay | Admitting: Pediatric Intensive Care

## 2018-06-27 NOTE — Telephone Encounter (Signed)
Left HIPAA complaint message to return call to [phone] regarding referral. Lisette Abu RN BSN CNP

## 2018-06-28 ENCOUNTER — Telehealth: Payer: Self-pay | Admitting: Pediatric Intensive Care

## 2018-06-28 NOTE — Telephone Encounter (Signed)
Call back from client. She saw [facility] clinic provider on 4/14 and has established care there. She has a follow up appointment in May.  She has an adequate supply of medication. Client will need an [employer] and dental referral going forward. No other needs at this time.  Lisette Abu RN BSN CNP [phone]

## 2018-07-06 NOTE — Progress Notes (Signed)
[place] Screening performed. Temperature, PHQ-9, and need for medical care and medications assessed. PHQ-9=9. Patient given a copy of the [employer]. Patient also reports that she has started feeling tired and  "bad" with chest tightness and difficulty breathing at times. Temperature taken twice, 30 minutes apart (97.6, 98.0)These symptom started this past Tuesday. Health Department called and process started. Paperwork sent to [employer] with the Health Department. Patient to be transported to the [facility] at 2pm to be tested and to stay quarantined until the test results are back or until the health department has cleared her.  Arnold Long RN MSN

## 2018-07-09 NOTE — Progress Notes (Signed)
Closing encounter per request. 

## 2018-07-15 NOTE — Progress Notes (Signed)
COVID Hotel Screening performed. Temperature, PHQ-9, and need for medical care and medications assessed. No additional needs assessed at this time.  Sahana Boyland RN MSN 

## 2018-07-18 ENCOUNTER — Other Ambulatory Visit (HOSPITAL_COMMUNITY): Payer: Self-pay | Admitting: *Deleted

## 2018-07-18 DIAGNOSIS — Z1231 Encounter for screening mammogram for malignant neoplasm of breast: Secondary | ICD-10-CM

## 2018-07-20 NOTE — Progress Notes (Signed)
COVID Hotel Screening performed. Temperature, PHQ-9, and need for medical care and medications assessed. No additional needs assessed at this time.  Laquasia Pincus RN MSN 

## 2018-07-27 NOTE — Progress Notes (Signed)
COVID Hotel Screening performed. Temperature, PHQ-9, and need for medical care and medications assessed. No additional needs assessed at this time.  Ladarrious Kirksey RN MSN 

## 2018-07-31 ENCOUNTER — Telehealth: Payer: Self-pay | Admitting: *Deleted

## 2018-07-31 DIAGNOSIS — Z20822 Contact with and (suspected) exposure to covid-19: Secondary | ICD-10-CM

## 2018-07-31 NOTE — Telephone Encounter (Signed)
Order placed for COVID-19 testing.  

## 2018-08-03 NOTE — Progress Notes (Signed)
COVID Hotel Screening performed. Temperature, PHQ-9, and need for medical care and medications assessed. Patient agreed to the COVID-19 testing. No additional needs assessed at this time.  Brenn Gatton RN MSN 

## 2018-08-08 LAB — NOVEL CORONAVIRUS, NAA: SARS-CoV-2, NAA: NOT DETECTED

## 2018-08-09 ENCOUNTER — Telehealth: Payer: Self-pay

## 2018-08-09 ENCOUNTER — Encounter: Payer: Self-pay | Admitting: *Deleted

## 2018-08-09 NOTE — Telephone Encounter (Signed)
Patient was advised of negative COVID results. Patient voiced understanding.

## 2018-09-14 ENCOUNTER — Other Ambulatory Visit: Payer: Self-pay | Admitting: Internal Medicine

## 2018-09-20 LAB — NOVEL CORONAVIRUS, NAA: SARS-CoV-2, NAA: NOT DETECTED

## 2018-10-24 NOTE — Progress Notes (Signed)
COVID-19 Screening performed. Temperature, PHQ-9, and need for medical care and medications assessed. No additional needs assessed at this time.  Nathyn Luiz MSN, RN 

## 2018-10-25 NOTE — Progress Notes (Unsigned)
COVID-19 Screening performed. Temperature, PHQ-9, and need for medical care and medications assessed. No additional needs assessed at this time.  Kiki Bivens MSN, RN 

## 2018-10-29 NOTE — Progress Notes (Signed)
COVID-19 Screening performed. Temperature, PHQ-9, and need for medical care and medications assessed. No additional needs assessed at this time.  Esmond Hinch MSN, RN 

## 2018-11-05 NOTE — Progress Notes (Signed)
COVID-19 Screening performed. Temperature, PHQ-9, and need for medical care and medications assessed. No additional needs assessed at this time.  Ezmae Speers MSN, RN 

## 2018-11-26 NOTE — Progress Notes (Signed)
COVID-19 Screening performed. Temperature, PHQ-9, and need for medical care and medications assessed. No additional needs assessed at this time.  Maleni Seyer MSN, RN 

## 2018-11-27 ENCOUNTER — Ambulatory Visit (HOSPITAL_COMMUNITY): Payer: Medicaid Other

## 2019-09-26 ENCOUNTER — Ambulatory Visit: Payer: Self-pay | Admitting: Nurse Practitioner

## 2019-11-26 ENCOUNTER — Encounter (HOSPITAL_COMMUNITY): Payer: Self-pay

## 2019-11-26 ENCOUNTER — Emergency Department (HOSPITAL_COMMUNITY)
Admission: EM | Admit: 2019-11-26 | Discharge: 2019-11-26 | Disposition: A | Discharge status: Home / Self Care | Payer: 59 | Attending: Emergency Medicine | Admitting: Emergency Medicine

## 2019-11-26 ENCOUNTER — Other Ambulatory Visit: Payer: Self-pay

## 2019-11-26 DIAGNOSIS — Z0130 Encounter for examination of blood pressure without abnormal findings: Secondary | ICD-10-CM | POA: Insufficient documentation

## 2019-11-26 DIAGNOSIS — Z7984 Long term (current) use of oral hypoglycemic drugs: Secondary | ICD-10-CM | POA: Diagnosis not present

## 2019-11-26 DIAGNOSIS — I10 Essential (primary) hypertension: Secondary | ICD-10-CM | POA: Diagnosis not present

## 2019-11-26 DIAGNOSIS — E119 Type 2 diabetes mellitus without complications: Secondary | ICD-10-CM | POA: Diagnosis not present

## 2019-11-26 DIAGNOSIS — R519 Headache, unspecified: Secondary | ICD-10-CM | POA: Insufficient documentation

## 2019-11-26 DIAGNOSIS — Z79899 Other long term (current) drug therapy: Secondary | ICD-10-CM | POA: Diagnosis not present

## 2019-11-26 NOTE — Discharge Instructions (Signed)
Please return to the ER if you develop any new or worsening symptoms.  It was a pleasure to meet you.

## 2019-11-26 NOTE — ED Triage Notes (Signed)
Pt presents today because of her BP. Pt reports that her BP was 150/99 as home but BP is 116/81 here in triage. Pt reports that she does take BP medication regularly. Pt c/o headache at this time.

## 2019-11-26 NOTE — ED Provider Notes (Signed)
[name]   CSN: [id number] Arrival date & time: 11/26/19  0854     History Chief Complaint  Patient presents with  . Blood Pressure Check    Vickie Marshall is a 54 y.o. female.  HPI Patient is a 54 year old female with a history of hypertension, diabetes mellitus, anxiety, who presents to the emergency department due to an elevated blood pressure.  Patient takes 20 mg of lisinopril and 25 mg of hydrochlorothiazide every morning.  She took her regular dose today.  She states that she was experiencing a headache and noticed that her blood pressure was 150/99 at home.  She became concerned and came to the emergency department for evaluation.  She notes a continued, mild, bitemporal headache.  Otherwise, patient has no complaints at this time.  No chest pain, shortness of breath, dizziness, lightheadedness, syncope.    Past Medical History:  Diagnosis Date  . Anxiety   . Diabetes mellitus without complication ([place])   . Hypertension   . Urinary tract infection 08/2017    Patient Active Problem List   Diagnosis Date Noted  . 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 Suicidal ideations 05/11/2017  . 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 Moderate episode of recurrent major depressive disorder ([place]) 05/11/2017    Past Surgical History:  Procedure Laterality Date  . CHOLECYSTECTOMY       OB History   No obstetric history on file.     Family History  Problem Relation Age of Onset  . Diabetes Mother   . Arthritis Mother   . Heart disease Mother   . Cancer Mother   . Hyperlipidemia Mother   . COPD Father     Social History   Tobacco Use  . Smoking status: Never Smoker  . Smokeless tobacco: Never Used  Substance Use Topics  . Alcohol use: Yes  . Drug use: No    Home Medications Prior to Admission medications   Medication Sig Start Date End Date Taking?  Authorizing Provider  busPIRone (BUSPAR) 15 MG tablet Take 1 tablet (15 mg total) by mouth 3 (three) times daily. For anxiety 01/17/18   Lindell Spar I, NP  cetirizine (ZYRTEC) 10 MG tablet Take 1 tablet (10 mg total) by mouth daily. (May buy from over the counter): For allergies 01/17/18   Lindell Spar I, NP  FLUoxetine (PROZAC) 20 MG capsule Take 1 capsule (20 mg total) by mouth daily. For depression 01/18/18   Lindell Spar I, NP  lisinopril-hydrochlorothiazide (PRINZIDE,ZESTORETIC) 20-25 MG tablet Take 1 tablet by mouth daily. For high blood pressure 01/17/18   Lindell Spar I, NP  metFORMIN (GLUCOPHAGE) 500 MG tablet Take 1 tablet (500 mg total) by mouth 2 (two) times daily with a meal. For diabetes management 01/17/18   Lindell Spar I, NP  traZODone (DESYREL) 50 MG tablet Take 1 tablet (50 mg total) by mouth at bedtime as needed for sleep. 01/17/18   Lindell Spar I, NP    Allergies    Asparagus, Codeine, and Eggs or egg-derived products  Review of Systems   Review of Systems  Respiratory: Negative for shortness of breath.   Cardiovascular: Negative for chest pain.  Neurological: Positive for headaches. Negative for dizziness, syncope, weakness, light-headedness and numbness.    Physical Exam Updated Vital Signs BP 116/81 (BP Location: Left Arm)   Pulse 98   Temp (!) 97.5 F (36.4 C) (Oral)   Resp 18  Ht 5' (1.524 m)   Wt 81.6 kg   SpO2 99%   BMI 35.15 kg/m   Physical Exam Vitals and nursing note reviewed.  Constitutional:      General: She is not in acute distress.    Appearance: Normal appearance. She is not ill-appearing, toxic-appearing or diaphoretic.  HENT:     Head: Normocephalic and atraumatic.     Right Ear: External ear normal.     Left Ear: External ear normal.     Nose: Nose normal.     Mouth/Throat:     Mouth: Mucous membranes are moist.     Pharynx: Oropharynx is clear. No oropharyngeal exudate or posterior oropharyngeal erythema.  Eyes:     Extraocular  Movements: Extraocular movements intact.  Cardiovascular:     Rate and Rhythm: Normal rate and regular rhythm.     Pulses: Normal pulses.     Heart sounds: Normal heart sounds. No murmur heard.  No friction rub. No gallop.      Comments: Heart is regular rate and rhythm.  No murmurs, rubs, gallops.  Blood pressure obtained during my physical exam and was 117/63 mmHg. Pulmonary:     Effort: Pulmonary effort is normal. No respiratory distress.     Breath sounds: Normal breath sounds. No stridor. No wheezing, rhonchi or rales.  Abdominal:     General: Abdomen is flat.  Musculoskeletal:        General: Normal range of motion.     Cervical back: Normal range of motion and neck supple. No tenderness.  Skin:    General: Skin is warm and dry.  Neurological:     General: No focal deficit present.     Mental Status: She is alert and oriented to person, place, and time.     Comments: Patient is oriented to person, place, and time. Patient phonates in clear, complete, and coherent sentences. Negative arm drift. Finger to nose intact bilaterally with no visible signs of dysmetria. Strength is 5/5 in all four extremities. Distal sensation intact in all four extremities.  Psychiatric:        Mood and Affect: Mood normal.        Behavior: Behavior normal.    ED Results / Procedures / Treatments   Labs (all labs ordered are listed, but only abnormal results are displayed) Labs Reviewed - No data to display  EKG None  Radiology No results found.  Procedures Procedures (including critical care time)  Medications Ordered in ED Medications - No data to display  ED Course  I have reviewed the triage vital signs and the nursing notes.  Pertinent labs & imaging results that were available during my care of the patient were reviewed by me and considered in my medical decision making (see chart for details).    MDM Rules/Calculators/A&P                          Patient presents today due to  an episode of hypertension that occurred this morning.  Upon arrival her BP was 116/81.  I rechecked her blood pressure on 2 additional occasions.  It was 117/63 during my physical exam.  I waited another 30 minutes and rechecked it and it is now 107/81.  Discussed her blood pressure in length.  Patient states that she has recently quite stressed and feels that this might be the cause of her symptoms.  She has been compliant with her hypertension medications.  She has  a primary care provider in the area.  I recommended that she schedule an appointment with her for reevaluation.  In the meantime, I recommended that she continue to monitor her blood pressure at home 2-3 times per day and journal her blood pressure over the next couple of weeks so that her primary care provider can have a better idea of her baseline BP.  Her physical exam was benign today.  Her neurological exam was reassuring.  Patient feels comfortable being discharged at this time.  I recommended that she come back to the ER if she develops new or worsening symptoms.  Her questions were answered and she was amicable to time of discharge.  Her vital signs are stable.  An After Visit Summary was printed and given to the patient.  Patient discharged to home/self care.  Condition at discharge: Stable  Note: Portions of this report may have been transcribed using voice recognition software. Every effort was made to ensure accuracy; however, inadvertent computerized transcription errors may be present.   Final Clinical Impression(s) / ED Diagnoses Final diagnoses:  Blood pressure check    Rx / DC Orders ED Discharge Orders    None       Rayna Sexton, PA-C 11/26/19 0721    Maudie Flakes, MD 11/26/19 1527

## 2020-11-30 ENCOUNTER — Emergency Department (HOSPITAL_COMMUNITY)
Admission: EM | Admit: 2020-11-30 | Discharge: 2020-11-30 | Disposition: A | Discharge status: Home / Self Care | Payer: 59 | Attending: Emergency Medicine | Admitting: Emergency Medicine

## 2020-11-30 ENCOUNTER — Other Ambulatory Visit: Payer: Self-pay

## 2020-11-30 ENCOUNTER — Encounter (HOSPITAL_COMMUNITY): Payer: Self-pay

## 2020-11-30 ENCOUNTER — Emergency Department (HOSPITAL_COMMUNITY): Payer: 59

## 2020-11-30 DIAGNOSIS — R519 Headache, unspecified: Secondary | ICD-10-CM | POA: Diagnosis present

## 2020-11-30 DIAGNOSIS — G43819 Other migraine, intractable, without status migrainosus: Secondary | ICD-10-CM | POA: Diagnosis not present

## 2020-11-30 DIAGNOSIS — Z79899 Other long term (current) drug therapy: Secondary | ICD-10-CM | POA: Diagnosis not present

## 2020-11-30 DIAGNOSIS — I10 Essential (primary) hypertension: Secondary | ICD-10-CM | POA: Diagnosis not present

## 2020-11-30 DIAGNOSIS — E119 Type 2 diabetes mellitus without complications: Secondary | ICD-10-CM | POA: Diagnosis not present

## 2020-11-30 DIAGNOSIS — Z7984 Long term (current) use of oral hypoglycemic drugs: Secondary | ICD-10-CM | POA: Diagnosis not present

## 2020-11-30 LAB — CBC WITH DIFFERENTIAL/PLATELET
Abs Immature Granulocytes: 0.02 10*3/uL (ref 0.00–0.07)
Basophils Absolute: 0 10*3/uL (ref 0.0–0.1)
Basophils Relative: 1 %
Eosinophils Absolute: 0.4 10*3/uL (ref 0.0–0.5)
Eosinophils Relative: 6 %
HCT: 41 % (ref 36.0–46.0)
Hemoglobin: 13.4 g/dL (ref 12.0–15.0)
Immature Granulocytes: 0 %
Lymphocytes Relative: 34 %
Lymphs Abs: 2.5 10*3/uL (ref 0.7–4.0)
MCH: 29.5 pg (ref 26.0–34.0)
MCHC: 32.7 g/dL (ref 30.0–36.0)
MCV: 90.1 fL (ref 80.0–100.0)
Monocytes Absolute: 0.8 10*3/uL (ref 0.1–1.0)
Monocytes Relative: 11 %
Neutro Abs: 3.6 10*3/uL (ref 1.7–7.7)
Neutrophils Relative %: 48 %
Platelets: 284 10*3/uL (ref 150–400)
RBC: 4.55 MIL/uL (ref 3.87–5.11)
RDW: 13.4 % (ref 11.5–15.5)
WBC: 7.3 10*3/uL (ref 4.0–10.5)
nRBC: 0 % (ref 0.0–0.2)

## 2020-11-30 LAB — COMPREHENSIVE METABOLIC PANEL
ALT: 31 U/L (ref 0–44)
AST: 30 U/L (ref 15–41)
Albumin: 4.3 g/dL (ref 3.5–5.0)
Alkaline Phosphatase: 59 U/L (ref 38–126)
Anion gap: 9 (ref 5–15)
BUN: 16 mg/dL (ref 6–20)
CO2: 28 mmol/L (ref 22–32)
Calcium: 10 mg/dL (ref 8.9–10.3)
Chloride: 106 mmol/L (ref 98–111)
Creatinine, Ser: 0.81 mg/dL (ref 0.44–1.00)
GFR, Estimated: >60 mL/min (ref >60)
Glucose, Bld: 109 mg/dL — ABNORMAL HIGH (ref 70–99)
Potassium: 4.3 mmol/L (ref 3.5–5.1)
Sodium: 143 mmol/L (ref 135–145)
Total Bilirubin: 0.6 mg/dL (ref 0.3–1.2)
Total Protein: 7.4 g/dL (ref 6.5–8.1)

## 2020-11-30 MED ORDER — METOCLOPRAMIDE HCL 5 MG/ML IJ SOLN
10.0000 mg | Freq: Once | INTRAMUSCULAR | Status: DC
  Filled 2020-11-30: qty 2

## 2020-11-30 MED ORDER — KETOROLAC TROMETHAMINE 15 MG/ML IJ SOLN
15.0000 mg | Freq: Once | INTRAMUSCULAR | Status: DC
  Filled 2020-11-30: qty 1

## 2020-11-30 MED ORDER — DIPHENHYDRAMINE HCL 25 MG PO CAPS
50.0000 mg | ORAL_CAPSULE | Freq: Once | ORAL | Status: DC
  Filled 2020-11-30: qty 2

## 2020-11-30 MED ORDER — IBUPROFEN 600 MG PO TABS: 600.0000 mg | ORAL_TABLET | Freq: Three times a day (TID) | ORAL | 0 refills | Status: DC | PRN

## 2020-11-30 MED ORDER — METOCLOPRAMIDE HCL 10 MG PO TABS: 10.0000 mg | ORAL_TABLET | Freq: Three times a day (TID) | ORAL | 0 refills | Status: DC | PRN

## 2020-11-30 NOTE — ED Provider Notes (Signed)
Emergency Medicine Provider Triage Evaluation Note  Vickie Marshall , a 55 y.o. female  was evaluated in triage.  Pt complains of presenting for evaluation of headache.  Patient states around 130 this afternoon she had blurry vision of both eyes.  She then developed light sensitivity and a headache mostly behind her right eye.  No trauma or injury.  No fevers or chills.  No slurred speech.  No numbness or weakness in upper or lower extremities.  No history of similar.  She has not taken anything including Tylenol or ibuprofen.  Review of Systems  Positive: HA, blurry vision, photosensitivity.  Negative: numbness  Physical Exam  BP 122/84 (BP Location: Left Arm)   Pulse 96   Temp 98 F (36.7 C) (Oral)   Resp 16   Ht 5' (1.524 m)   Wt 80.7 kg   LMP 04/25/2017   SpO2 99%   BMI 34.76 kg/m  Gen:   Awake, no distress   Resp:  Normal effort  MSK:   Moves extremities without difficulty  Other:  CN intact.  Strength and sensation intact x4.  Negative pronator drift.  EOMI.  Medical Decision Making  Medically screening exam initiated at 4:20 PM.  Appropriate orders placed.  Vickie Marshall was informed that the remainder of the evaluation will be completed by another provider, this initial triage assessment does not replace that evaluation, and the importance of remaining in the ED until their evaluation is complete.  Labs, ct head   Franchot Heidelberg, PA-C 11/30/20 1622    Lajean Saver, MD 12/01/20 [phone]

## 2020-11-30 NOTE — ED Triage Notes (Signed)
Patient c/o headache, with pain behind her right eye. Patient also reports blurred vision and sensitivity to light.

## 2020-11-30 NOTE — Discharge Instructions (Addendum)
Take ibuprofen 3 times a day with meals as needed for headache.  Do not take other anti-inflammatories at the same time (Advil, Motrin, naproxen, Aleve).  When taking this medication, you should also take the Reglan with this, as this can help with migraines You may supplement with Tylenol if you need further pain control. Follow-up with your primary care doctor as needed for recheck of symptoms. Return to the emergency room with any new, worsening, concerning symptoms

## 2020-11-30 NOTE — ED Provider Notes (Signed)
[facility] DEPT Provider Note   CSN: [id number] Arrival date & time: 11/30/20  1552     History Chief Complaint  Patient presents with   Headache   Blurred Vision    Vickie Marshall is a 55 y.o. female presenting for evaluation of headache and blurry vision.   Patient states around 130 this afternoon she developed bilateral blurry vision.  She then developed photosensitivity both eyes, and pain behind her right eye.  She has had a headache there since.  She has not taken anything for it including Tylenol ibuprofen.  Nothing Makes her symptoms better or worse.  She denies a history of migraines or headaches.  She denies fall, trauma, or injury.  No neck pain.  No fevers, chills, cough, chest pain, shortness breath, nausea, vomiting, abdominal pain, urinary symptoms, normal bowel movements.  No nasal congestion, sore throat, or ear pain.  Additional history attending therapy.  Patient with a history of anxiety, diabetes, hypertension, depression, conversion disorder.  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     Family History  Problem Relation Age of Onset   Diabetes Mother    Arthritis Mother    Heart disease Mother    Cancer Mother    Hyperlipidemia Mother    COPD Father     Social History   Tobacco Use   Smoking status: Never   Smokeless tobacco: Never  Vaping Use   Vaping Use: Never  used  Substance Use Topics   Alcohol use: Not Currently   Drug use: No    Home Medications Prior to Admission medications   Medication Sig Start Date End Date Taking? Authorizing Provider  ibuprofen (ADVIL) 600 MG tablet Take 1 tablet (600 mg total) by mouth every 8 (eight) hours as needed for headache, mild pain or moderate pain. 11/30/20  Yes Beacher Every, PA-C  metoCLOPramide (REGLAN) 10 MG tablet Take 1 tablet (10 mg total) by mouth every 8 (eight) hours as needed for nausea or vomiting (headache). 11/30/20  Yes Charmayne Odell, PA-C  busPIRone (BUSPAR) 15 MG tablet Take 1 tablet (15 mg total) by mouth 3 (three) times daily. For anxiety 01/17/18   Lindell Spar I, NP  cetirizine (ZYRTEC) 10 MG tablet Take 1 tablet (10 mg total) by mouth daily. (May buy from over the counter): For allergies 01/17/18   Lindell Spar I, NP  FLUoxetine (PROZAC) 20 MG capsule Take 1 capsule (20 mg total) by mouth daily. For depression 01/18/18   Lindell Spar I, NP  lisinopril-hydrochlorothiazide (PRINZIDE,ZESTORETIC) 20-25 MG tablet Take 1 tablet by mouth daily. For high blood pressure 01/17/18   Lindell Spar I, NP  metFORMIN (GLUCOPHAGE) 500 MG tablet Take 1 tablet (500 mg total) by mouth 2 (two) times daily with a meal. For diabetes management 01/17/18   Lindell Spar I, NP  traZODone (DESYREL) 50 MG tablet Take 1  tablet (50 mg total) by mouth at bedtime as needed for sleep. 01/17/18   Lindell Spar I, NP    Allergies    Asparagus, Codeine, and Eggs or egg-derived products  Review of Systems   Review of Systems  Eyes:  Positive for photophobia and visual disturbance.  Neurological:  Positive for headaches.  All other systems reviewed and are negative.  Physical Exam Updated Vital Signs BP (!) 135/92 (BP Location: Right Arm)   Pulse 92   Temp 98.1 F (36.7 C) (Oral)   Resp 18   Ht 5' (1.524 m)   Wt 80.7 kg   LMP 04/25/2017   SpO2 100%   BMI 34.76 kg/m   Physical Exam Vitals and nursing  note reviewed.  Constitutional:      General: She is not in acute distress.    Appearance: Normal appearance.     Comments: nontoxic  HENT:     Head: Normocephalic and atraumatic.  Eyes:     Extraocular Movements: Extraocular movements intact.     Conjunctiva/sclera: Conjunctivae normal.     Pupils: Pupils are equal, round, and reactive to light.  Cardiovascular:     Rate and Rhythm: Normal rate and regular rhythm.     Pulses: Normal pulses.  Pulmonary:     Effort: Pulmonary effort is normal. No respiratory distress.     Breath sounds: Normal breath sounds. No wheezing.     Comments: Speaking in full sentences.  Clear lung sounds in all fields. Abdominal:     General: There is no distension.     Palpations: Abdomen is soft. There is no mass.     Tenderness: There is no abdominal tenderness. There is no guarding or rebound.  Musculoskeletal:        General: Normal range of motion.     Cervical back: Normal range of motion and neck supple.  Skin:    General: Skin is warm and dry.     Capillary Refill: Capillary refill takes less than 2 seconds.  Neurological:     Mental Status: She is alert and oriented to person, place, and time.     GCS: GCS eye subscore is 4. GCS verbal subscore is 5. GCS motor subscore is 6.     Cranial Nerves: Cranial nerves are intact.     Sensory: Sensation is intact.     Motor: Motor function is intact.     Coordination: Coordination is intact.     Comments: No acute neurologic deficits  Psychiatric:        Mood and Affect: Mood and affect normal.        Speech: Speech normal.        Behavior: Behavior normal.    ED Results / Procedures / Treatments   Labs (all labs ordered are listed, but only abnormal results are displayed) Labs Reviewed  COMPREHENSIVE METABOLIC PANEL - Abnormal; Notable for the following components:      Result Value   Glucose, Bld 109 (*)    All other components within normal limits  CBC WITH DIFFERENTIAL/PLATELET     EKG None  Radiology CT HEAD WO CONTRAST (5MM)  Result Date: 11/30/2020 CLINICAL DATA:  Headache since 1:30 p.m., blurred vision, photophobia EXAM: CT HEAD WITHOUT CONTRAST TECHNIQUE: Contiguous axial images were obtained from the base of the skull through the vertex without intravenous contrast. COMPARISON:  None. FINDINGS: Brain: No acute infarct or hemorrhage. Lateral ventricles and midline structures are unremarkable. No acute extra-axial fluid collections. No mass  effect. Vascular: No hyperdense vessel or unexpected calcification. Skull: Normal. Negative for fracture or focal lesion. Sinuses/Orbits: No acute finding. Other: None. IMPRESSION: 1. No acute intracranial process. Electronically Signed   By: Randa Ngo M.D.   On: 11/30/2020 17:57    Procedures Procedures   Medications Ordered in ED Medications - No data to display  ED Course  I have reviewed the triage vital signs and the nursing notes.  Pertinent labs & imaging results that were available during my care of the patient were reviewed by me and considered in my medical decision making (see chart for details).    MDM Rules/Calculators/A&P                           Patient presenting for evaluation of headache, blurry vision, and photophobia.  On exam, patient peers nontoxic.  No obvious neurologic deficits.  Most likely migraine.  However in the setting of not having history and acute onset pain, will obtain CT head.  Labs ordered as well.  Labs interpreted me, overall reassuring.  CT head negative for acute findings.  On reevaluation, patient does not report any improvement of pain, however clinically she appears improved.  She is no longer covering her eyes and is reading her phone comfortably in a well lit room.  Offered headache cocktail, patient declined, instead will go home and take medication.  Discussed follow-up with PCP, patient is agreeable.  At this time, patient appears safe for discharge.  Return  precautions given.  Patient states she understands and agrees to plan  Final Clinical Impression(s) / ED Diagnoses Final diagnoses:  Other migraine without status migrainosus, intractable    Rx / DC Orders ED Discharge Orders          Ordered    ibuprofen (ADVIL) 600 MG tablet  Every 8 hours PRN        11/30/20 1928    metoCLOPramide (REGLAN) 10 MG tablet  Every 8 hours PRN        11/30/20 1928             Franchot Heidelberg, PA-C 11/30/20 1945    Valarie Merino, MD 11/30/20 2350

## 2021-06-06 ENCOUNTER — Emergency Department (HOSPITAL_COMMUNITY)
Admission: EM | Admit: 2021-06-06 | Discharge: 2021-06-08 | Disposition: A | Discharge status: Other Acute Inpatient Hospital | Payer: 59 | Attending: Emergency Medicine | Admitting: Emergency Medicine

## 2021-06-06 ENCOUNTER — Other Ambulatory Visit: Payer: Self-pay

## 2021-06-06 DIAGNOSIS — T464X2A Poisoning by angiotensin-converting-enzyme inhibitors, intentional self-harm, initial encounter: Secondary | ICD-10-CM | POA: Insufficient documentation

## 2021-06-06 DIAGNOSIS — I10 Essential (primary) hypertension: Secondary | ICD-10-CM | POA: Diagnosis not present

## 2021-06-06 DIAGNOSIS — T391X2A Poisoning by 4-Aminophenol derivatives, intentional self-harm, initial encounter: Secondary | ICD-10-CM | POA: Diagnosis not present

## 2021-06-06 DIAGNOSIS — T452X2A Poisoning by vitamins, intentional self-harm, initial encounter: Secondary | ICD-10-CM | POA: Diagnosis not present

## 2021-06-06 DIAGNOSIS — Z7984 Long term (current) use of oral hypoglycemic drugs: Secondary | ICD-10-CM | POA: Diagnosis not present

## 2021-06-06 DIAGNOSIS — E119 Type 2 diabetes mellitus without complications: Secondary | ICD-10-CM | POA: Diagnosis not present

## 2021-06-06 DIAGNOSIS — Z79899 Other long term (current) drug therapy: Secondary | ICD-10-CM | POA: Diagnosis not present

## 2021-06-06 DIAGNOSIS — Z20822 Contact with and (suspected) exposure to covid-19: Secondary | ICD-10-CM | POA: Diagnosis not present

## 2021-06-06 DIAGNOSIS — F333 Major depressive disorder, recurrent, severe with psychotic symptoms: Secondary | ICD-10-CM | POA: Diagnosis not present

## 2021-06-06 DIAGNOSIS — T1491XA Suicide attempt, initial encounter: Secondary | ICD-10-CM

## 2021-06-06 LAB — URINALYSIS, ROUTINE W REFLEX MICROSCOPIC
Glucose, UA: NEGATIVE mg/dL
Ketones, ur: NEGATIVE mg/dL
Nitrite: NEGATIVE
Protein, ur: NEGATIVE mg/dL
Specific Gravity, Urine: 1.003 — ABNORMAL LOW (ref 1.005–1.030)
pH: 6 (ref 5.0–8.0)

## 2021-06-06 LAB — COMPREHENSIVE METABOLIC PANEL
ALT: 29 U/L (ref 0–44)
AST: 28 U/L (ref 15–41)
Albumin: 4.2 g/dL (ref 3.5–5.0)
Alkaline Phosphatase: 56 U/L (ref 38–126)
Anion gap: 7 (ref 5–15)
BUN: 18 mg/dL (ref 6–20)
CO2: 29 mmol/L (ref 22–32)
Calcium: 9.4 mg/dL (ref 8.9–10.3)
Chloride: 101 mmol/L (ref 98–111)
Creatinine, Ser: 0.84 mg/dL (ref 0.44–1.00)
GFR, Estimated: >60 mL/min (ref >60)
Glucose, Bld: 111 mg/dL — ABNORMAL HIGH (ref 70–99)
Potassium: 3.3 mmol/L — ABNORMAL LOW (ref 3.5–5.1)
Sodium: 137 mmol/L (ref 135–145)
Total Bilirubin: 0.3 mg/dL (ref 0.3–1.2)
Total Protein: 7.4 g/dL (ref 6.5–8.1)

## 2021-06-06 LAB — RAPID URINE DRUG SCREEN, HOSP PERFORMED
Amphetamines: NOT DETECTED
Barbiturates: NOT DETECTED
Benzodiazepines: NOT DETECTED
Cocaine: NOT DETECTED
Opiates: NOT DETECTED
Tetrahydrocannabinol: NOT DETECTED

## 2021-06-06 LAB — RESP PANEL BY RT-PCR (FLU A&B, COVID) ARPGX2
Influenza A by PCR: NEGATIVE
Influenza B by PCR: NEGATIVE
SARS Coronavirus 2 by RT PCR: NEGATIVE

## 2021-06-06 LAB — CBC WITH DIFFERENTIAL/PLATELET
Abs Immature Granulocytes: 0.01 10*3/uL (ref 0.00–0.07)
Basophils Absolute: 0.1 10*3/uL (ref 0.0–0.1)
Basophils Relative: 1 %
Eosinophils Absolute: 0.2 10*3/uL (ref 0.0–0.5)
Eosinophils Relative: 4 %
HCT: 38.4 % (ref 36.0–46.0)
Hemoglobin: 12.5 g/dL (ref 12.0–15.0)
Immature Granulocytes: 0 %
Lymphocytes Relative: 41 %
Lymphs Abs: 2.2 10*3/uL (ref 0.7–4.0)
MCH: 28.6 pg (ref 26.0–34.0)
MCHC: 32.6 g/dL (ref 30.0–36.0)
MCV: 87.9 fL (ref 80.0–100.0)
Monocytes Absolute: 0.6 10*3/uL (ref 0.1–1.0)
Monocytes Relative: 11 %
Neutro Abs: 2.4 10*3/uL (ref 1.7–7.7)
Neutrophils Relative %: 43 %
Platelets: 245 10*3/uL (ref 150–400)
RBC: 4.37 MIL/uL (ref 3.87–5.11)
RDW: 13.6 % (ref 11.5–15.5)
WBC: 5.5 10*3/uL (ref 4.0–10.5)
nRBC: 0 % (ref 0.0–0.2)

## 2021-06-06 LAB — SALICYLATE LEVEL: Salicylate Lvl: <7 mg/dL — ABNORMAL LOW (ref 7.0–30.0)

## 2021-06-06 LAB — ACETAMINOPHEN LEVEL: Acetaminophen (Tylenol), Serum: 24 ug/mL (ref 10–30)

## 2021-06-06 LAB — I-STAT BETA HCG BLOOD, ED (MC, WL, AP ONLY): I-stat hCG, quantitative: <5 m[IU]/mL (ref <5)

## 2021-06-06 LAB — ETHANOL: Alcohol, Ethyl (B): <10 mg/dL (ref <10)

## 2021-06-06 MED ORDER — LISINOPRIL 20 MG PO TABS
20.0000 mg | ORAL_TABLET | Freq: Every day | ORAL | Status: DC
  Administered 2021-06-07: 20 mg via ORAL
  Filled 2021-06-06: qty 2
  Filled 2021-06-06 (×2): qty 1

## 2021-06-06 MED ORDER — LISINOPRIL-HYDROCHLOROTHIAZIDE 20-25 MG PO TABS: 1.0000 | ORAL_TABLET | Freq: Every day | ORAL | Status: DC

## 2021-06-06 MED ORDER — OMEPRAZOLE 20 MG PO CPDR
20.0000 mg | DELAYED_RELEASE_CAPSULE | ORAL | Status: DC
  Administered 2021-06-07: 20 mg via ORAL
  Filled 2021-06-06: qty 1

## 2021-06-06 MED ORDER — METFORMIN HCL 500 MG PO TABS
500.0000 mg | ORAL_TABLET | Freq: Two times a day (BID) | ORAL | Status: DC
  Administered 2021-06-07 – 2021-06-08 (×3): 500 mg via ORAL
  Filled 2021-06-06 (×3): qty 1

## 2021-06-06 MED ORDER — SODIUM CHLORIDE 0.9 % IV BOLUS
1000.0000 mL | Freq: Once | INTRAVENOUS | Status: AC
  Administered 2021-06-06: 1000 mL via INTRAVENOUS

## 2021-06-06 MED ORDER — HYDROCHLOROTHIAZIDE 25 MG PO TABS
25.0000 mg | ORAL_TABLET | Freq: Every day | ORAL | Status: DC
  Administered 2021-06-07: 25 mg via ORAL
  Filled 2021-06-06 (×3): qty 1

## 2021-06-06 MED ORDER — FLUOXETINE HCL 20 MG PO CAPS
40.0000 mg | ORAL_CAPSULE | Freq: Every day | ORAL | Status: DC
  Administered 2021-06-06 – 2021-06-08 (×3): 40 mg via ORAL
  Filled 2021-06-06 (×3): qty 2

## 2021-06-06 NOTE — ED Notes (Signed)
Patient moved to room 27.  Patient alert and oriented.  Calm at the time.  Sitter with patient.  Patient had two gray colored earrings.  Earrings removed and placed in secured cabinet with other personal belongings ?

## 2021-06-06 NOTE — ED Triage Notes (Addendum)
Patient presented to ed with c/o SI. Patient reported that she overdose on 12 tabs of 25 mg benadryl, 30 of lisinopril unknown dosage and 30 tabs of melatonin of 10 mg at 1130 am today. VS'S: BP 164/88 HR 78, CBG 184 RR 16 sat 98% RA ?
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
## 2021-06-06 NOTE — BH Assessment (Signed)
@  1629, requested patient's nurse Kandice Moos, RN) to set up the TTS machine for patient's initial TTS assessment.  ? ?

## 2021-06-06 NOTE — ED Notes (Signed)
Poison control call and was update on patient labs. Poison control have clear the patient at 1830. ?

## 2021-06-06 NOTE — ED Provider Notes (Signed)
?[facility] ?Provider Note ? ? ?CSN: [id number] ?Arrival date & time: 06/06/21  1327 ? ?  ? ?History ? ?Chief Complaint  ?Patient presents with  ? Suicide Attempt  ? ? ?Vickie Marshall is a 56 y.o. female. Patient presents to the emergency department after an intentional overdose. The patient states that she is suicidal and tried to kill herself today by taking 12 acetaminophen tablets, 30 lisinopril-hctz tablets, and 12 melatonin tablets. She texted her landlord who called 911. The patient states, "Why didn't it work?"  The patient states she has no homicidal intent today. She denies auditory or visual hallucinations. She denies any complaints at this time. Denies shortness of breath, chest pain, abdominal pain. PMH significant for DM, HTN, anxiety, cholecystectomy, visit for suicidal ideations 01/2018.  ? ? ? ?HPI ? ?  ? ?Home Medications ?Prior to Admission medications   ?Medication Sig Start Date End Date Taking? Authorizing Provider  ?cetirizine (ZYRTEC) 10 MG tablet Take 1 tablet (10 mg total) by mouth daily. (May buy from over the counter): For allergies 01/17/18  Yes Lindell Spar I, NP  ?diclofenac Sodium (VOLTAREN) 1 % GEL Apply 2 g topically daily as needed (pain).   Yes [provider]  ?FLUoxetine (PROZAC) 40 MG capsule Take 40 mg by mouth daily.   Yes [provider]  ?ibuprofen (ADVIL) 200 MG tablet Take 400 mg by mouth every 6 (six) hours as needed for headache or mild pain.   Yes [provider]  ?lisinopril-hydrochlorothiazide (PRINZIDE,ZESTORETIC) 20-25 MG tablet Take 1 tablet by mouth daily. For high blood pressure 01/17/18  Yes Encarnacion Slates, NP  ?Melatonin 10 MG TABS Take 10 mg by mouth at bedtime as needed (sleep).   Yes [provider]  ?metFORMIN (GLUCOPHAGE) 500 MG tablet Take 1 tablet (500 mg total) by mouth 2 (two) times daily with a meal. For diabetes management 01/17/18  Yes Encarnacion Slates, NP  ?Multiple Vitamin  (MULTIVITAMIN) capsule Take 1 capsule by mouth daily.   Yes [provider]  ?omeprazole (PRILOSEC OTC) 20 MG tablet Take 20 mg by mouth 2 (two) times a week.   Yes [provider]  ?   ? ?Allergies    ?Asparagus, Codeine, and Eggs or egg-derived products   ? ?Review of Systems   ?Review of Systems  ?Constitutional:  Negative for fever.  ?Respiratory:  Negative for chest tightness and shortness of breath.   ?Cardiovascular:  Negative for chest pain.  ?Gastrointestinal:  Negative for abdominal pain, nausea and vomiting.  ?Psychiatric/Behavioral:  Positive for self-injury and suicidal ideas.   ? ?Physical Exam ?Updated Vital Signs ?BP 140/82   Pulse 60   Temp 97.6 ?F (36.4 ?C) (Oral)   Resp 16   Ht 5' (1.524 m)   Wt 83.5 kg   LMP 04/25/2017   SpO2 99%   BMI 35.94 kg/m?  ?Physical Exam ?Constitutional:   ?   Comments: Somnolent  ?HENT:  ?   Head: Normocephalic and atraumatic.  ?Eyes:  ?   Pupils: Pupils are equal, round, and reactive to light.  ?Cardiovascular:  ?   Rate and Rhythm: Normal rate and regular rhythm.  ?   Pulses: Normal pulses.  ?   Heart sounds: Normal heart sounds.  ?Pulmonary:  ?   Effort: Pulmonary effort is normal. No respiratory distress.  ?   Breath sounds: Normal breath sounds.  ?Abdominal:  ?   Palpations: Abdomen is soft.  ?  Tenderness: There is no abdominal tenderness.  ?Musculoskeletal:  ?   Cervical back: Normal range of motion.  ?Skin: ?   General: Skin is warm and dry.  ?Neurological:  ?   Mental Status: She is alert and oriented to person, place, and time.  ?Psychiatric:  ?   Comments: Suicidal intent  ? ? ?ED Results / Procedures / Treatments   ?Labs ?(all labs ordered are listed, but only abnormal results are displayed) ?Labs Reviewed  ?URINALYSIS, ROUTINE W REFLEX MICROSCOPIC - Abnormal; Notable for the following components:  ?    Result Value  ? Color, Urine COLORLESS (*)   ? Specific Gravity, Urine 1.003 (*)   ? Hgb urine dipstick SMALL (*)   ? Bilirubin  Urine SMALL (*)   ? Leukocytes,Ua TRACE (*)   ? Bacteria, UA RARE (*)   ? All other components within normal limits  ?COMPREHENSIVE METABOLIC PANEL - Abnormal; Notable for the following components:  ? Potassium 3.3 (*)   ? Glucose, Bld 111 (*)   ? All other components within normal limits  ?SALICYLATE LEVEL - Abnormal; Notable for the following components:  ? Salicylate Lvl <4.6 (*)   ? All other components within normal limits  ?RESP PANEL BY RT-PCR (FLU A&B, COVID) ARPGX2  ?RAPID URINE DRUG SCREEN, HOSP PERFORMED  ?ACETAMINOPHEN LEVEL  ?ETHANOL  ?CBC WITH DIFFERENTIAL/PLATELET  ?I-STAT BETA HCG BLOOD, ED ([facility], [facility], [facility] ONLY)  ? ? ?EKG ?None ? ?Radiology ?No results found. ? ?Procedures ?Procedures  ? ? ?Medications Ordered in ED ?Medications  ?FLUoxetine (PROZAC) capsule 40 mg (has no administration in time range)  ?lisinopril-hydrochlorothiazide (ZESTORETIC) 20-25 MG per tablet 1 tablet (has no administration in time range)  ?metFORMIN (GLUCOPHAGE) tablet 500 mg (has no administration in time range)  ?omeprazole (PRILOSEC OTC) EC tablet 20 mg (has no administration in time range)  ?sodium chloride 0.9 % bolus 1,000 mL (0 mLs Intravenous Stopped 06/06/21 1838)  ? ? ?ED Course/ Medical Decision Making/ A&P ?Clinical Course as of 06/06/21 1857  ?Sun Jun 06, 2021  ?1440 Patient sleeping [LM]  ?1441 BP: 124/74 [LM]  ?  ?Clinical Course User Index ?[LM] Dorothyann Peng, PA-C  ? ?                        ?Medical Decision Making ?Amount and/or Complexity of Data Reviewed ?Labs: ordered. ? ? ?The patient presents today with intentional overdose with intent to commit suicide. The patient took 30 lisinopril-hctz, 10-12 acetaminophen, 12 melatonin.  ? ?Poison control was contacted. Recommended 6 hours observation from time of ingestion. Time of ingestion was 1130. 7.5 hrs have elapsed. No significant BP readings. ? ?Labs were ordered. Relevant results: Potassium 3.3.  Negative UDC ? ?EKG shows sinus rhythm.  ? ?At this time the  patient is medically cleared. Patient here due to suicidal thoughts with attempt. Disposition per psychiatry ? ?Final Clinical Impression(s) / ED Diagnoses ?Final diagnoses:  ?Suicide attempt [facility])  ? ? ?Rx / DC Orders ?ED Discharge Orders   ? ? None  ? ?  ? ? ?  ?Dorothyann Peng, PA-C ?06/06/21 1857 ? ?  ?Charlesetta Shanks, MD ?06/17/21 [phone] ? ?

## 2021-06-07 NOTE — ED Provider Notes (Signed)
Emergency Medicine Observation Re-evaluation Note ? ?Vickie Marshall is a 56 y.o. female, seen on rounds today.  Pt initially presented to the ED for complaints of Suicide Attempt ?Currently, the patient is sleeping. ? ?Physical Exam  ?BP 106/64 (BP Location: Left Arm)   Pulse 72   Temp (!) 97.2 ?F (36.2 ?C) (Oral)   Resp 18   Ht 5' (1.524 m)   Wt 83.5 kg   LMP 04/25/2017   SpO2 98%   BMI 35.94 kg/m?  ?Physical Exam ?General: sleeping and in nad ?Cardiac: regular rate ?Lungs: no tachypnea ?Psych: suicidal ? ?ED Course / MDM  ?EKG:EKG Interpretation ? ?Date/Time:  Sunday June 06 2021 14:03:48 EDT ?Ventricular Rate:  65 ?PR Interval:  166 ?QRS Duration: 100 ?QT Interval:  393 ?QTC Calculation: 409 ?R Axis:   50 ?Text Interpretation: Sinus rhythm Confirmed by Ripley Fraise [phone]) on 06/07/2021 2:36:57 AM ? ?I have reviewed the labs performed to date as well as medications administered while in observation.  Recent changes in the last 24 hours include no acute events overnight.  Pt has been cooperative. ? ?Plan  ?Current plan is for in pt currently looking for bed. ? Vickie Marshall is not under involuntary commitment. ? ? ?  ?Blanchie Dessert, MD ?06/07/21 [phone] ? ?

## 2021-06-07 NOTE — Progress Notes (Signed)
[facility] LCSW Progress Note ?  ?06/07/2021    3:30 PM ? ?Vickie Marshall  ? ?[id number]  ? ?Type of Contact and Topic:  Psychiatric Bed Placement  ? ?Pt accepted to [facility] [place]  ? ?Patient meets inpatient criteria per Darrol Angel, NP ? ?The attending provider will be Dr. Alcide Clever  ? ?Call report to [phone] ? ?Laurena Spies, RN @ [facility] ED notified.    ? ?Pt scheduled  to arrive at [facility].  ? ? ?Mariea Clonts, MSW, LCSW-A  ?3:31 PM 06/07/2021   ?  ? ?  ?  ? ? ? ? ?  ?

## 2021-06-07 NOTE — Progress Notes (Signed)
Patient has been denied by [facility] due to no appropriate beds available. Patient meets [facility] inpatient criteria per Darrol Angel, NP. Patient has been faxed out to the following facilities:  ? ? ?[facility]  [address], [place] [address] [phone]  ?[place], [place] [address] [phone]  ?[place]  [place], [place] [address] [phone]  ?[facility]  [address] [place] [address] [phone]  ?[facility]  [place], [place] [address] [phone]  ?[facility]  [address], [place] [address] [phone]  ?[facility]  [address] [place] [address] [phone]  ?[place]  [address] [place] [address] [phone]  ?[facility]  [address], [place] [address] [phone]  ?[facility]  [address] [place] [address] [phone]  ?[facility]  [place], [place] [address] [phone]  ?[facility]  [address], [place] [address] [phone]  ?[place] [address], [place] [address] [phone]  ?[facility]  [address] [place] [address] [phone]  ?[facility]  [address] [place] [address] [phone]  ? ?Mariea Clonts, MSW, LCSW-A  ?3:09 PM 06/07/2021   ?

## 2021-06-08 NOTE — ED Notes (Signed)
Patient DC d off unit to facility per provider. Patient alert, cooperative, calm, no s/s of distress. DC information given to [employer]. Patient ambulatory off unit, escorted by RN. Patient transported by [employer].  ?

## 2021-06-21 ENCOUNTER — Telehealth: Payer: Self-pay

## 2021-06-21 NOTE — Telephone Encounter (Signed)
Advised [facility] that Dr Cruzita Lederer is not taking patients at this time and that our new patients appointments are scheduled in October.  They will contact us to let us know how to proceed with referral. ?

## 2021-08-12 ENCOUNTER — Encounter (HOSPITAL_COMMUNITY): Payer: Self-pay

## 2021-08-12 ENCOUNTER — Ambulatory Visit (HOSPITAL_COMMUNITY)
Admission: EM | Admit: 2021-08-12 | Discharge: 2021-08-12 | Disposition: A | Discharge status: Home / Self Care | Payer: 59 | Attending: Emergency Medicine | Admitting: Emergency Medicine

## 2021-08-12 DIAGNOSIS — J069 Acute upper respiratory infection, unspecified: Secondary | ICD-10-CM

## 2021-08-12 MED ORDER — DEXTROMETHORPHAN HBR 15 MG PO CAPS: 30.0000 mg | ORAL_CAPSULE | Freq: Three times a day (TID) | ORAL | 0 refills | Status: DC

## 2021-08-12 MED ORDER — GUAIFENESIN ER 600 MG PO TB12: 600.0000 mg | ORAL_TABLET | Freq: Every day | ORAL | 1 refills | Status: DC

## 2021-08-12 MED ORDER — DIPHENHYDRAMINE HCL 25 MG PO TABS: 25.0000 mg | ORAL_TABLET | Freq: Every evening | ORAL | 0 refills | Status: DC

## 2021-08-12 NOTE — Discharge Instructions (Signed)
You can take the medicines as needed for your congestion. The cough medicine can be taken if you feel your symptoms don't improve with mucinex or benadryl.  Please return to the urgent care or emergency department if symptoms worsen or do not improve.

## 2021-08-12 NOTE — ED Triage Notes (Signed)
Pt c/o productive cough with green sputum, sneezing, nasal drainage, sore throat, and headache x2 days. No relief with OTC meds.

## 2021-08-12 NOTE — ED Provider Notes (Signed)
[place]    CSN: [id number] Arrival date & time: 08/12/21  0944     History   Chief Complaint Chief Complaint  Vickie Marshall presents with   Cough    HPI Vickie Marshall Marshall a 56 y.o. female.  Presents with 2-day history of nasal congestion, cough, sore throat, sneezing.  Vickie Marshall has been trying elderberry for her symptoms.  Vickie Marshall did try ibuprofen at the onset of sore throat which helped.  Now feels throat Marshall raw from coughing.  Vickie Marshall has not tried any other medications.  Denies fever, chills, shortness of breath, chest pain, vomiting, abdominal pain, diarrhea. No history of asthma, COPD, non-smoker.  Past Medical History:  Diagnosis Date   Anxiety    Diabetes mellitus without complication ([place])    Hypertension    Urinary tract infection 08/2017    Vickie Marshall Active Problem List   Diagnosis Date Noted   MDD (major depressive disorder), recurrent, severe, with psychosis ([place]) 01/13/2018   Suicidal ideations 05/11/2017   Psychologic conversion disorder 05/11/2017   Moderate episode of recurrent major depressive disorder ([place]) 05/11/2017    Past Surgical History:  Procedure Laterality Date   CHOLECYSTECTOMY     TOE SURGERY      OB History   No obstetric history on file.      Home Medications    Prior to Admission medications   Medication Sig Start Date End Date Taking? Authorizing Provider  Dextromethorphan HBr 15 MG CAPS Take 2 capsules (30 mg total) by mouth every 8 (eight) hours. 08/12/21  Yes Shalini Mair, Wells Guiles, PA-C  diphenhydrAMINE (BENADRYL) 25 MG tablet Take 1 tablet (25 mg total) by mouth at bedtime. 08/12/21  Yes Pretty Weltman, Wells Guiles, PA-C  guaiFENesin (MUCINEX) 600 MG 12 hr tablet Take 1 tablet (600 mg total) by mouth daily. 08/12/21  Yes Mirenda Baltazar, Wells Guiles, PA-C  cetirizine (ZYRTEC) 10 MG tablet Take 1 tablet (10 mg total) by mouth daily. (May buy from over the counter): For allergies 01/17/18   Lindell Spar I, NP  diclofenac Sodium (VOLTAREN) 1 % GEL Apply 2 g topically  daily as needed (pain).    [provider]  FLUoxetine (PROZAC) 40 MG capsule Take 40 mg by mouth daily.    [provider]  ibuprofen (ADVIL) 200 MG tablet Take 400 mg by mouth every 6 (six) hours as needed for headache or mild pain.    [provider]  lisinopril-hydrochlorothiazide (PRINZIDE,ZESTORETIC) 20-25 MG tablet Take 1 tablet by mouth daily. For high blood pressure 01/17/18   Nwoko, Herbert Pun I, NP  Melatonin 10 MG TABS Take 10 mg by mouth at bedtime as needed (sleep).    [provider]  metFORMIN (GLUCOPHAGE) 500 MG tablet Take 1 tablet (500 mg total) by mouth 2 (two) times daily with a meal. For diabetes management 01/17/18   Lindell Spar I, NP  Multiple Vitamin (MULTIVITAMIN) capsule Take 1 capsule by mouth daily.    [provider]  omeprazole (PRILOSEC OTC) 20 MG tablet Take 20 mg by mouth 2 (two) times a week.    [provider]    Family History Family History  Problem Relation Age of Onset   Diabetes Mother    Arthritis Mother    Heart disease Mother    Cancer Mother    Hyperlipidemia Mother    COPD Father     Social History Social History   Tobacco Use   Smoking status: Never   Smokeless tobacco: Never  Vaping Use   Vaping  Use: Never used  Substance Use Topics   Alcohol use: Not Currently   Drug use: No     Allergies   Asparagus, Codeine, and Eggs or egg-derived products   Review of Systems Review of Systems  Respiratory:  Positive for cough.    Per HPI  Physical Exam Triage Vital Signs ED Triage Vitals [08/12/21 1046]  Enc Vitals Group     BP (!) 152/86     Pulse Rate (!) 108     Resp 18     Temp 98.6 F (37 C)     Temp Source Oral     SpO2 94 %     Weight      Height      Head Circumference      Peak Flow      Pain Score 4     Pain Loc      Pain Edu?      Excl. in [place]?    No data found.  Updated Vital Signs BP 122/81 (BP Location: Left Arm)   Pulse 99   Temp 99.7 F (37.6 C)  (Oral)   Resp 18   LMP 04/18/2017   SpO2 96%    Physical Exam Vitals and nursing note reviewed.  Constitutional:      General: Vickie Marshall not in acute distress. HENT:     Right Ear: Tympanic membrane and ear canal normal.     Left Ear: Tympanic membrane and ear canal normal.     Nose: Congestion present.     Mouth/Throat:     Mouth: Mucous membranes are moist.     Pharynx: Uvula midline. No posterior oropharyngeal erythema.     Tonsils: No tonsillar exudate or tonsillar abscesses.  Eyes:     Conjunctiva/sclera: Conjunctivae normal.     Pupils: Pupils are equal, round, and reactive to light.  Cardiovascular:     Rate and Rhythm: Normal rate and regular rhythm.     Heart sounds: Normal heart sounds.  Pulmonary:     Effort: Pulmonary effort Marshall normal. No respiratory distress.     Breath sounds: Normal breath sounds. No wheezing, rhonchi or rales.  Abdominal:     General: Bowel sounds are normal.     Palpations: Abdomen Marshall soft.     Tenderness: There Marshall no abdominal tenderness.  Musculoskeletal:        General: Normal range of motion.     Cervical back: Normal range of motion.  Lymphadenopathy:     Cervical: No cervical adenopathy.  Skin:    General: Skin Marshall warm and dry.  Neurological:     Mental Status: Vickie Marshall alert and oriented to person, place, and time.      UC Treatments / Results  Labs (all labs ordered are listed, but only abnormal results are displayed) Labs Reviewed - No data to display  EKG  Radiology No results found.  Procedures Procedures (including critical care time)  Medications Ordered in UC Medications - No data to display  Initial Impression / Assessment and Plan / UC Course  I have reviewed the triage vital signs and the nursing notes.  Pertinent labs & imaging results that were available during my care of the Vickie Marshall were reviewed by me and considered in my medical decision making (see chart for details).  Physical exam Marshall reassuring.   Vickie Marshall prefers not to take medicines however I do recommend Vickie Marshall try Mucinex for congestion.  Vickie Marshall can take this daily to see if it  helps with congestion and cough.  Vickie Marshall states Benadryl makes her sleepy and I recommend Vickie Marshall take this at nighttime if Vickie Marshall having trouble sleeping due to symptoms.  We discussed use of the cough medicine if Vickie Marshall feels her symptoms are not improved in the next 3 to 4 days.  Vickie Marshall can take ibuprofen for pain or fever.  Vickie Marshall can also continue her elderberry if Vickie Marshall feels this Marshall beneficial for her.  I recommend breathing in hot steamy shower air to loosen up mucus and congestion.  We discussed return precautions and symptoms to look for that would need treatment at the emergency department.  Other return precautions were discussed.  Vickie Marshall agrees to plan.  Vickie Marshall discharged in stable condition.  Final Clinical Impressions(s) / UC Diagnoses   Final diagnoses:  Viral URI with cough     Discharge Instructions      You can take the medicines as needed for your congestion. The cough medicine can be taken if you feel your symptoms don't improve with mucinex or benadryl.  Please return to the urgent care or emergency department if symptoms worsen or do not improve.    ED Prescriptions     Medication Sig Dispense Auth. Provider   diphenhydrAMINE (BENADRYL) 25 MG tablet Take 1 tablet (25 mg total) by mouth at bedtime. 30 tablet Kwasi Joung, PA-C   guaiFENesin (MUCINEX) 600 MG 12 hr tablet Take 1 tablet (600 mg total) by mouth daily. 30 tablet Max Romano, Wells Guiles, PA-C   Dextromethorphan HBr 15 MG CAPS Take 2 capsules (30 mg total) by mouth every 8 (eight) hours. 30 capsule Dorna Mallet, Wells Guiles, PA-C      PDMP not reviewed this encounter.   Rhylei Mcquaig, Wells Guiles, PA-C 08/12/21 1210

## 2021-08-17 ENCOUNTER — Ambulatory Visit (HOSPITAL_COMMUNITY)
Admission: EM | Admit: 2021-08-17 | Discharge: 2021-08-17 | Disposition: A | Discharge status: Home / Self Care | Payer: 59 | Attending: Physician Assistant | Admitting: Physician Assistant

## 2021-08-17 ENCOUNTER — Encounter (HOSPITAL_COMMUNITY): Payer: Self-pay | Admitting: *Deleted

## 2021-08-17 ENCOUNTER — Other Ambulatory Visit: Payer: Self-pay

## 2021-08-17 DIAGNOSIS — J329 Chronic sinusitis, unspecified: Secondary | ICD-10-CM

## 2021-08-17 DIAGNOSIS — J40 Bronchitis, not specified as acute or chronic: Secondary | ICD-10-CM

## 2021-08-17 DIAGNOSIS — R051 Acute cough: Secondary | ICD-10-CM

## 2021-08-17 MED ORDER — AMOXICILLIN-POT CLAVULANATE 875-125 MG PO TABS: 1.0000 | ORAL_TABLET | Freq: Two times a day (BID) | ORAL | 0 refills | Status: DC

## 2021-08-17 NOTE — Discharge Instructions (Signed)
Take Augmentin twice daily for 7 days.  Continue your over-the-counter medications including Mucinex, allergy medicine, Tylenol.  Make sure you rest and drink plenty of fluids.  Follow-up with [place] or your primary care if symptoms have not improved within a week.  If anything worsens and you have high fever, chest pain, shortness of breath, nausea, vomiting, worsening cough you need to be seen immediately.

## 2021-08-17 NOTE — ED Triage Notes (Signed)
Pt reports for one week she has had URI sx's . Pt last seen 08-12-21 for sam Sx's . Pt continues to have cough with green sputum,sneezing,sore throat and HA since 08-10-21.

## 2021-08-17 NOTE — ED Provider Notes (Signed)
[place]    CSN: [id number] Arrival date & time: 08/17/21  8588      History   Chief Complaint Chief Complaint  Patient presents with   Cough   Nasal Congestion   Ear Fullness    HPI Vickie Marshall is a 56 y.o. female.   Patient presents today with a weeklong history of URI symptoms.  She was seen 08/12/2021 at which point symptoms were attributed to viral illness.  She has been using over-the-counter medications as prescribed with worsening of symptoms prompting reevaluation.  Reports severe cough, sneezing, headache, sore throat, sinus congestion.  She denies any fever, chest pain, shortness of breath, nausea, vomiting.  She has been using elderberry, vitamin C, Robitussin, Mucinex with minimal improvement of symptoms.  Denies any known sick contacts.  She does have a history of allergies but takes Zyrtec daily and reports current symptoms are more extreme than previous episodes of this condition.  She denies history of asthma, COPD, smoking.  She does have diabetes but her blood sugars are well controlled.      Past Medical History:  Diagnosis Date   Anxiety    Diabetes mellitus without complication ([place])    Hypertension    Urinary tract infection 08/2017    Patient Active Problem List   Diagnosis Date Noted   MDD (major depressive disorder), recurrent, severe, with psychosis ([place]) 01/13/2018   Suicidal ideations 05/11/2017   Psychologic conversion disorder 05/11/2017   Moderate episode of recurrent major depressive disorder ([place]) 05/11/2017    Past Surgical History:  Procedure Laterality Date   CHOLECYSTECTOMY     TOE SURGERY      OB History   No obstetric history on file.      Home Medications    Prior to Admission medications   Medication Sig Start Date End Date Taking? Authorizing Provider  amoxicillin-clavulanate (AUGMENTIN) 875-125 MG tablet Take 1 tablet by mouth every 12 (twelve) hours. 08/17/21  Yes Seretha Estabrooks K, PA-C  cetirizine  (ZYRTEC) 10 MG tablet Take 1 tablet (10 mg total) by mouth daily. (May buy from over the counter): For allergies 01/17/18   Lindell Spar I, NP  Dextromethorphan HBr 15 MG CAPS Take 2 capsules (30 mg total) by mouth every 8 (eight) hours. 08/12/21   Rising, Wells Guiles, PA-C  diclofenac Sodium (VOLTAREN) 1 % GEL Apply 2 g topically daily as needed (pain).    [provider]  diphenhydrAMINE (BENADRYL) 25 MG tablet Take 1 tablet (25 mg total) by mouth at bedtime. 08/12/21   Rising, Wells Guiles, PA-C  FLUoxetine (PROZAC) 40 MG capsule Take 40 mg by mouth daily.    [provider]  guaiFENesin (MUCINEX) 600 MG 12 hr tablet Take 1 tablet (600 mg total) by mouth daily. 08/12/21   Rising, Wells Guiles, PA-C  ibuprofen (ADVIL) 200 MG tablet Take 400 mg by mouth every 6 (six) hours as needed for headache or mild pain.    [provider]  lisinopril-hydrochlorothiazide (PRINZIDE,ZESTORETIC) 20-25 MG tablet Take 1 tablet by mouth daily. For high blood pressure 01/17/18   Nwoko, Herbert Pun I, NP  Melatonin 10 MG TABS Take 10 mg by mouth at bedtime as needed (sleep).    [provider]  metFORMIN (GLUCOPHAGE) 500 MG tablet Take 1 tablet (500 mg total) by mouth 2 (two) times daily with a meal. For diabetes management 01/17/18   Lindell Spar I, NP  Multiple Vitamin (MULTIVITAMIN) capsule Take 1 capsule by mouth daily.    [provider]  omeprazole (PRILOSEC OTC) 20 MG tablet Take 20 mg by mouth 2 (two) times a week.    [provider]    Family History Family History  Problem Relation Age of Onset   Diabetes Mother    Arthritis Mother    Heart disease Mother    Cancer Mother    Hyperlipidemia Mother    COPD Father     Social History Social History   Tobacco Use   Smoking status: Never   Smokeless tobacco: Never  Vaping Use   Vaping Use: Never used  Substance Use Topics   Alcohol use: Not Currently   Drug use: No     Allergies   Asparagus, Codeine, and Eggs or  egg-derived products   Review of Systems Review of Systems  Constitutional:  Positive for activity change and fatigue. Negative for appetite change and fever.  HENT:  Positive for congestion, sinus pressure and sore throat. Negative for sneezing.   Respiratory:  Positive for cough. Negative for shortness of breath.   Cardiovascular:  Negative for chest pain.  Gastrointestinal:  Negative for abdominal pain, diarrhea, nausea and vomiting.  Neurological:  Positive for headaches. Negative for dizziness and light-headedness.     Physical Exam Triage Vital Signs ED Triage Vitals  Enc Vitals Group     BP 08/17/21 1010 117/82     Pulse Rate 08/17/21 1010 82     Resp 08/17/21 1010 18     Temp 08/17/21 1010 98 F (36.7 C)     Temp src --      SpO2 08/17/21 1010 97 %     Weight --      Height --      Head Circumference --      Peak Flow --      Pain Score 08/17/21 1008 0     Pain Loc --      Pain Edu? --      Excl. in [place]? --    No data found.  Updated Vital Signs BP 117/82   Pulse 82   Temp 98 F (36.7 C)   Resp 18   LMP 04/18/2017   SpO2 97%   Visual Acuity Right Eye Distance:   Left Eye Distance:   Bilateral Distance:    Right Eye Near:   Left Eye Near:    Bilateral Near:     Physical Exam Vitals reviewed.  Constitutional:      General: She is awake. She is not in acute distress.    Appearance: Normal appearance. She is well-developed. She is not ill-appearing.     Comments: Very pleasant female appears stated age in no acute distress sitting comfortably in exam room  HENT:     Head: Normocephalic and atraumatic.     Right Ear: Ear canal and external ear normal. A middle ear effusion is present.     Left Ear: Ear canal and external ear normal. A middle ear effusion is present.     Nose:     Right Sinus: Maxillary sinus tenderness and frontal sinus tenderness present.     Left Sinus: Maxillary sinus tenderness and frontal sinus tenderness present.      Mouth/Throat:     Pharynx: Uvula midline. Posterior oropharyngeal erythema present. No oropharyngeal exudate.     Comments: Erythema and drainage in posterior oropharynx Cardiovascular:     Rate and Rhythm: Normal rate and regular rhythm.     Heart sounds: Normal heart sounds, S1 normal and S2  normal. No murmur heard. Pulmonary:     Effort: Pulmonary effort is normal.     Breath sounds: Normal breath sounds. No wheezing, rhonchi or rales.     Comments: Clear to auscultation bilaterally Psychiatric:        Behavior: Behavior is cooperative.      UC Treatments / Results  Labs (all labs ordered are listed, but only abnormal results are displayed) Labs Reviewed - No data to display  EKG   Radiology No results found.  Procedures Procedures (including critical care time)  Medications Ordered in UC Medications - No data to display  Initial Impression / Assessment and Plan / UC Course  I have reviewed the triage vital signs and the nursing notes.  Pertinent labs & imaging results that were available during my care of the patient were reviewed by me and considered in my medical decision making (see chart for details).     No indication for viral testing as patient has been symptomatic for over a week and this would not change management.  Given recent worsening of symptoms will cover for secondary infection with Augmentin twice daily for 7 days.  Recommend she continue over-the-counter medications including allergy medication, Mucinex, Flonase, Tylenol.  She is to rest and drink plenty of fluid.  X-ray was deferred as her oxygen saturation is 85% without adventitious lung sounds on exam but discussed that if she continues to have symptoms despite antibiotic she should return and we will consider x-ray.  She is to rest and drink plenty of fluid.  She was provided work excuse note as requested.  Discussed that if she has any worsening symptoms including high fever, chest pain, shortness  of breath, worsening cough, nausea/vomiting, weakness that she needs to be seen immediately to which she expressed understanding.  Final Clinical Impressions(s) / UC Diagnoses   Final diagnoses:  Sinobronchitis  Acute cough     Discharge Instructions      Take Augmentin twice daily for 7 days.  Continue your over-the-counter medications including Mucinex, allergy medicine, Tylenol.  Make sure you rest and drink plenty of fluids.  Follow-up with [place] or your primary care if symptoms have not improved within a week.  If anything worsens and you have high fever, chest pain, shortness of breath, nausea, vomiting, worsening cough you need to be seen immediately.     ED Prescriptions     Medication Sig Dispense Auth. Provider   amoxicillin-clavulanate (AUGMENTIN) 875-125 MG tablet Take 1 tablet by mouth every 12 (twelve) hours. 14 tablet Brynne Doane, Derry Skill, PA-C      PDMP not reviewed this encounter.   Terrilee Croak, PA-C 08/17/21 1028

## 2021-12-24 ENCOUNTER — Encounter (HOSPITAL_COMMUNITY): Payer: Self-pay | Admitting: *Deleted

## 2022-01-06 IMAGING — CT CT HEAD W/O CM
3 series · 14 of 47 positions shown, 16 images · non-contrast
Comparison: None.

CLINICAL DATA: Headache since [DATE] p.m., blurred vision,
photophobia

EXAM:
CT HEAD WITHOUT CONTRAST
TECHNIQUE: Contiguous axial images were obtained from the base of the skull
through the vertex without intravenous contrast.

[Series 2: head wo · axial · 0.47mm/px · z∈[-173,-48]mm · 8 of 30 slices shown, 10 images]
[im 3/30  brain]
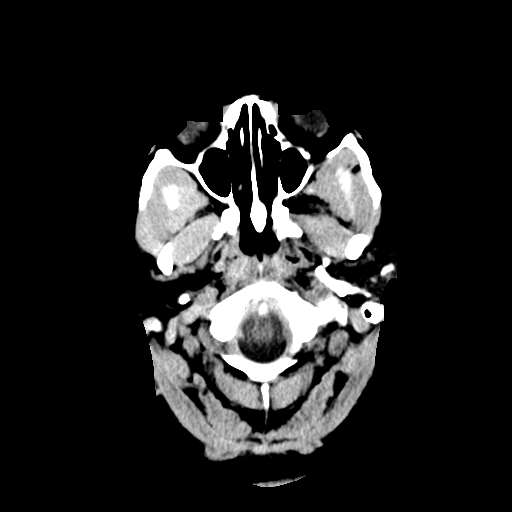
[im 3/30  bone]
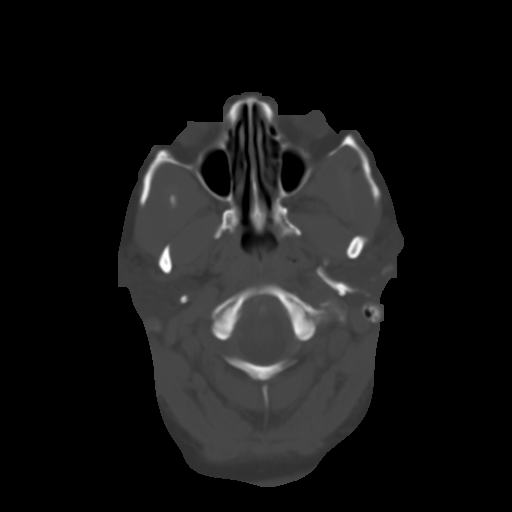
[im 7/30  brain]
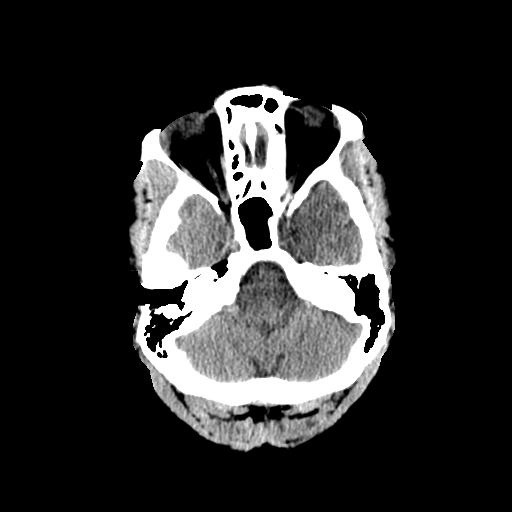
[im 10/30  brain]
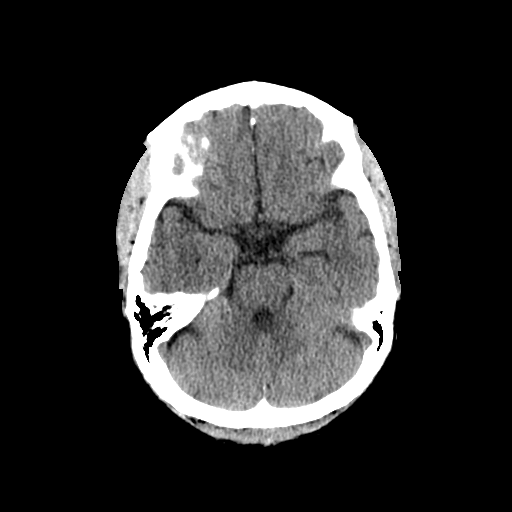
[im 14/30  brain]
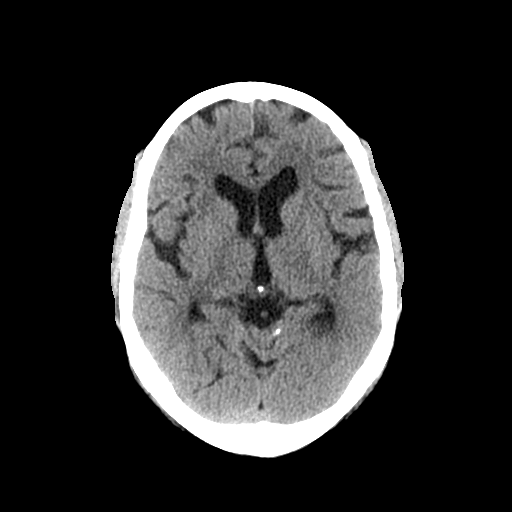
[im 17/30  brain]
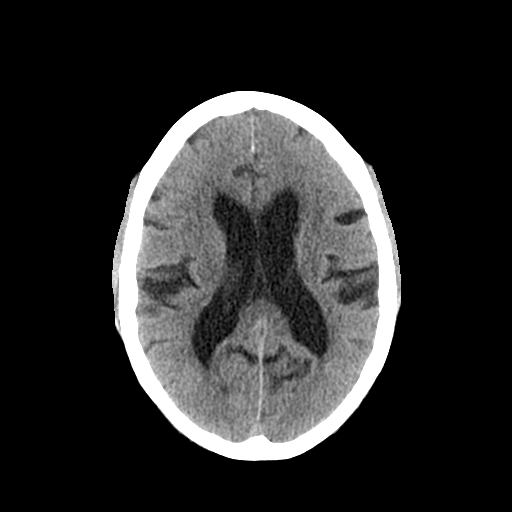
[im 17/30  bone]
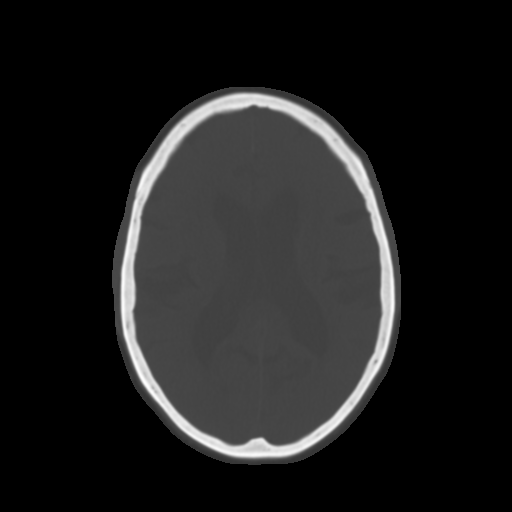
[im 21/30  brain]
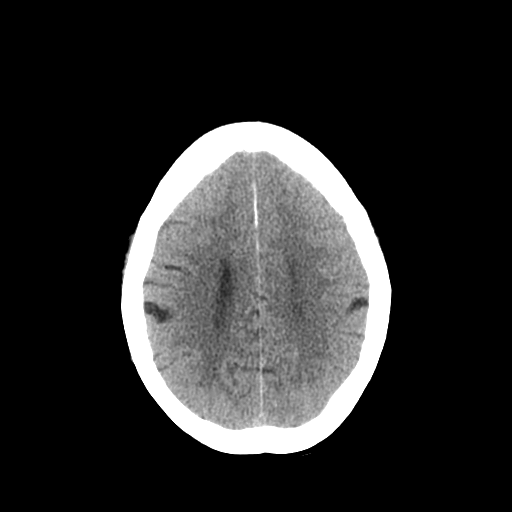
[im 24/30  brain]
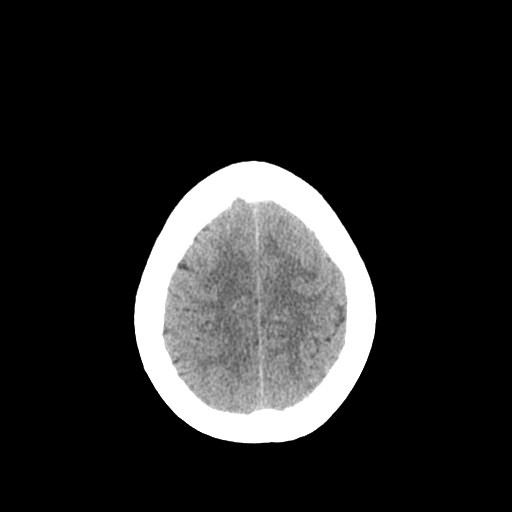
[im 28/30  brain]
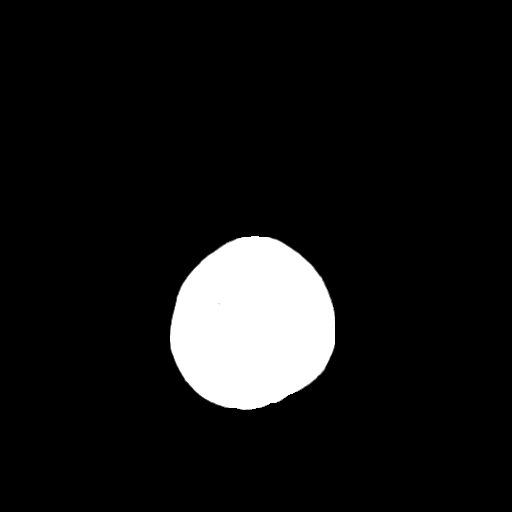

[Series 4: coronal soft tissue · coronal · 0.31mm/px · 3 of 62 slices shown]
[im 21/62  brain]
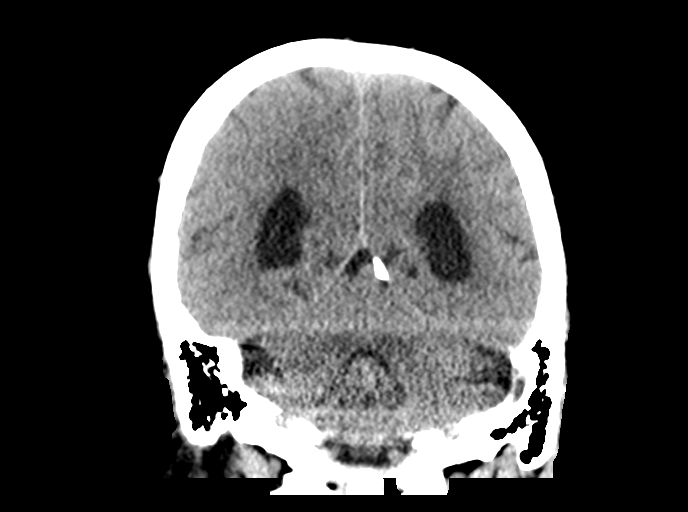
[im 28/62  brain]
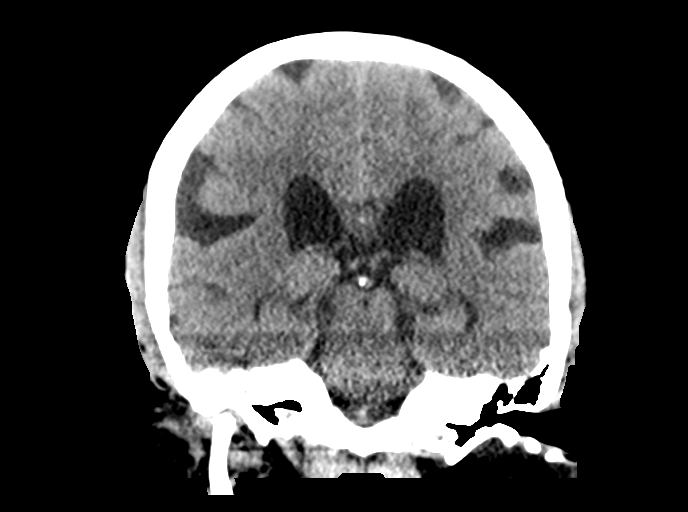
[im 34/62  brain]
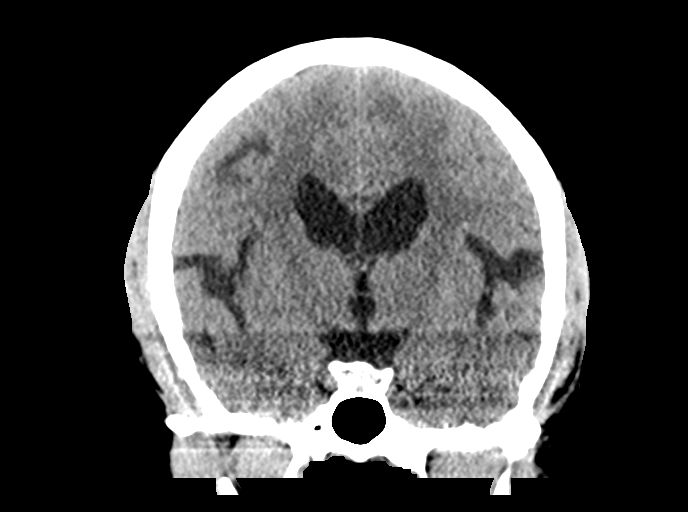

[Series 5: sagittal soft tissue · sagittal · 0.33mm/px · 3 of 50 slices shown]
[im 17/50  brain]
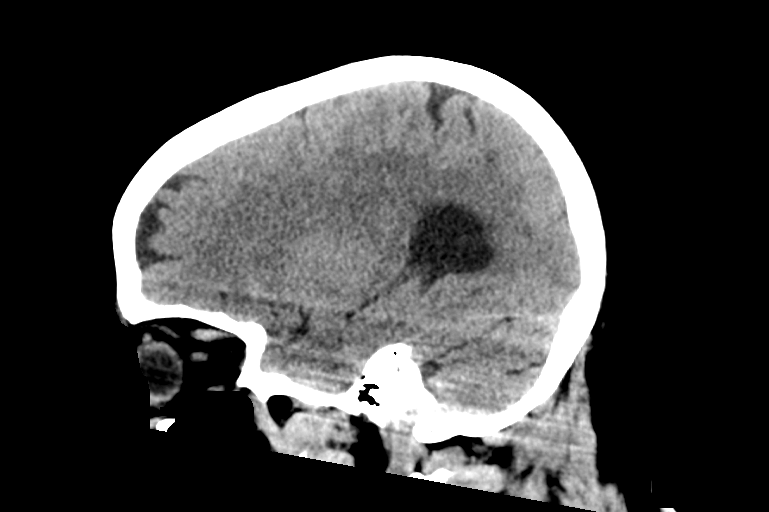
[im 25/50  brain]
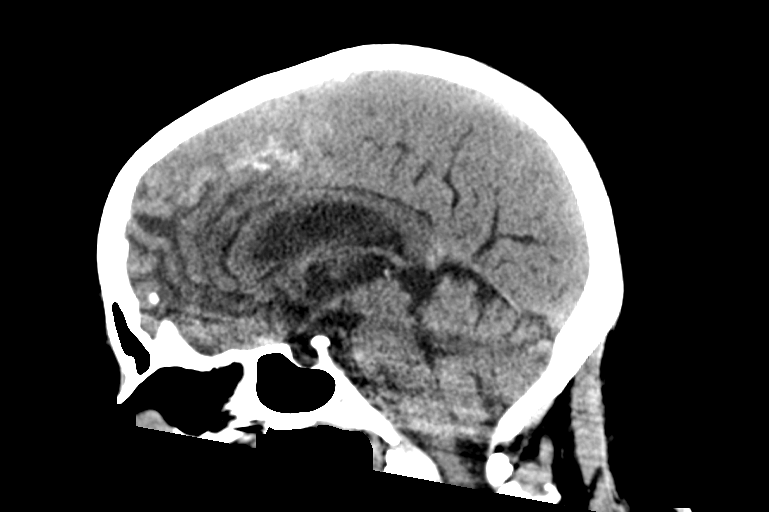
[im 33/50  brain]
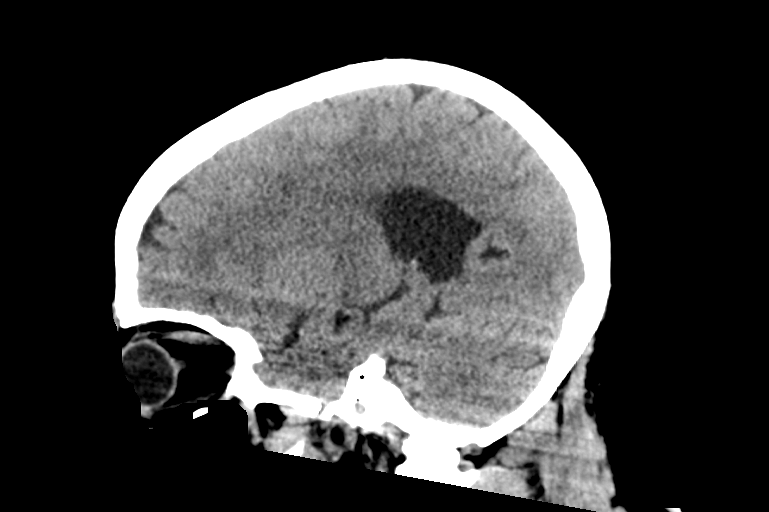

[14 of 47 positions shown; findings below may reference images not displayed]

FINDINGS: Brain: No acute infarct or hemorrhage. Lateral ventricles and
midline structures are unremarkable. No acute extra-axial fluid
collections. No mass effect.

Vascular: No hyperdense vessel or unexpected calcification.

Skull: Normal. Negative for fracture or focal lesion.

Sinuses/Orbits: No acute finding.

Other: None.
IMPRESSION: 1. No acute intracranial process.

## 2022-04-21 ENCOUNTER — Other Ambulatory Visit: Payer: Self-pay | Admitting: Obstetrics and Gynecology

## 2022-04-21 DIAGNOSIS — N611 Abscess of the breast and nipple: Secondary | ICD-10-CM

## 2022-05-10 ENCOUNTER — Other Ambulatory Visit: Payer: Medicaid Other

## 2022-05-21 ENCOUNTER — Encounter (HOSPITAL_COMMUNITY): Payer: Self-pay | Admitting: *Deleted

## 2022-05-21 ENCOUNTER — Emergency Department (HOSPITAL_COMMUNITY)
Admission: EM | Admit: 2022-05-21 | Discharge: 2022-05-21 | Disposition: A | Discharge status: Home / Self Care | Payer: BLUE CROSS/BLUE SHIELD | Attending: Emergency Medicine | Admitting: Emergency Medicine

## 2022-05-21 ENCOUNTER — Other Ambulatory Visit: Payer: Self-pay

## 2022-05-21 DIAGNOSIS — Z7984 Long term (current) use of oral hypoglycemic drugs: Secondary | ICD-10-CM | POA: Insufficient documentation

## 2022-05-21 DIAGNOSIS — E119 Type 2 diabetes mellitus without complications: Secondary | ICD-10-CM | POA: Insufficient documentation

## 2022-05-21 DIAGNOSIS — X58XXXA Exposure to other specified factors, initial encounter: Secondary | ICD-10-CM | POA: Diagnosis not present

## 2022-05-21 DIAGNOSIS — S21001A Unspecified open wound of right breast, initial encounter: Secondary | ICD-10-CM | POA: Insufficient documentation

## 2022-05-21 MED ORDER — DOXYCYCLINE HYCLATE 100 MG PO CAPS: 100.0000 mg | ORAL_CAPSULE | Freq: Two times a day (BID) | ORAL | 0 refills | Status: DC

## 2022-05-21 NOTE — Discharge Instructions (Signed)
I prescribed you antibiotics.  I have also given you referral to breast center.  For any concerning symptoms please return to the emergency department.

## 2022-05-21 NOTE — ED Triage Notes (Signed)
C/o wound under her right breast x 1 year, states it will start to get better then start hurting again, states it drained all day yest. . No drainage at present.

## 2022-05-21 NOTE — ED Provider Notes (Signed)
[facility] Provider Note   CSN: [id number] Arrival date & time: 05/21/22  1120     History  Chief Complaint  Patient presents with   Wound Check    Vickie Marshall is a 57 y.o. female.  57 year old female presents today for evaluation of wound to her right breast.  This has been present for about a month and is not improving.  She states she saw her PCP who recommended she get a diagnostic mammogram.  She states she has not been able to get a mammogram done because her insurance was not accepted.  She denies fever.  States she had some oozing from the wound over the past couple days.  No prior Naasz to this region.  She has diabetes.  The history is provided by the patient. No language interpreter was used.       Home Medications Prior to Admission medications   Medication Sig Start Date End Date Taking? Authorizing Provider  amoxicillin-clavulanate (AUGMENTIN) 875-125 MG tablet Take 1 tablet by mouth every 12 (twelve) hours. 08/17/21   Raspet, Derry Skill, PA-C  cetirizine (ZYRTEC) 10 MG tablet Take 1 tablet (10 mg total) by mouth daily. (May buy from over the counter): For allergies 01/17/18   Lindell Spar I, NP  Dextromethorphan HBr 15 MG CAPS Take 2 capsules (30 mg total) by mouth every 8 (eight) hours. 08/12/21   Rising, Wells Guiles, PA-C  diclofenac Sodium (VOLTAREN) 1 % GEL Apply 2 g topically daily as needed (pain).    [provider]  diphenhydrAMINE (BENADRYL) 25 MG tablet Take 1 tablet (25 mg total) by mouth at bedtime. 08/12/21   Rising, Wells Guiles, PA-C  FLUoxetine (PROZAC) 40 MG capsule Take 40 mg by mouth daily.    [provider]  guaiFENesin (MUCINEX) 600 MG 12 hr tablet Take 1 tablet (600 mg total) by mouth daily. 08/12/21   Rising, Wells Guiles, PA-C  ibuprofen (ADVIL) 200 MG tablet Take 400 mg by mouth every 6 (six) hours as needed for headache or mild pain.    [provider]  lisinopril-hydrochlorothiazide  (PRINZIDE,ZESTORETIC) 20-25 MG tablet Take 1 tablet by mouth daily. For high blood pressure 01/17/18   Nwoko, Herbert Pun I, NP  Melatonin 10 MG TABS Take 10 mg by mouth at bedtime as needed (sleep).    [provider]  metFORMIN (GLUCOPHAGE) 500 MG tablet Take 1 tablet (500 mg total) by mouth 2 (two) times daily with a meal. For diabetes management 01/17/18   Lindell Spar I, NP  Multiple Vitamin (MULTIVITAMIN) capsule Take 1 capsule by mouth daily.    [provider]  omeprazole (PRILOSEC OTC) 20 MG tablet Take 20 mg by mouth 2 (two) times a week.    [provider]      Allergies    Asparagus, Codeine, and Egg-derived products    Review of Systems   Review of Systems  Constitutional:  Negative for chills and fever.  Skin:  Positive for wound.  All other systems reviewed and are negative.   Physical Exam Updated Vital Signs BP 116/82 (BP Location: Right Arm)   Pulse 89   Temp 98.3 F (36.8 C) (Oral)   Resp 16   Ht 5' (1.524 m)   Wt 53.1 kg   LMP 04/18/2017   SpO2 99%   BMI 22.85 kg/m  Physical Exam Vitals and nursing note reviewed.  Constitutional:      General: She is not in acute distress.  Appearance: Normal appearance. She is not ill-appearing.  HENT:     Head: Normocephalic and atraumatic.     Nose: Nose normal.  Eyes:     General: No scleral icterus.    Extraocular Movements: Extraocular movements intact.     Conjunctiva/sclera: Conjunctivae normal.  Cardiovascular:     Rate and Rhythm: Normal rate and regular rhythm.     Pulses: Normal pulses.  Pulmonary:     Effort: Pulmonary effort is normal. No respiratory distress.     Breath sounds: Normal breath sounds. No wheezing or rales.  Chest:    Abdominal:     General: There is no distension.     Tenderness: There is no abdominal tenderness.  Musculoskeletal:        General: Normal range of motion.     Cervical back: Normal range of motion.  Skin:    General: Skin is warm and dry.   Neurological:     General: No focal deficit present.     Mental Status: She is alert. Mental status is at baseline.     ED Results / Procedures / Treatments   Labs (all labs ordered are listed, but only abnormal results are displayed) Labs Reviewed - No data to display  EKG None  Radiology No results found.  Procedures Procedures    Medications Ordered in ED Medications - No data to display  ED Course/ Medical Decision Making/ A&P                             Medical Decision Making  57 year old female presents with wound to her right breast.  Measures about 1.5 x 2 cm.  Superficial.  No induration or fluctuance.  No evidence of an abscess.  No surrounding erythema.  However will prescribe doxycycline to see if it improves until she follows up with the breast center.  Breast center referral given.  Patient is in agreement with this plan.  Patient is appropriate for discharge.   Final Clinical Impression(s) / ED Diagnoses Final diagnoses:  Wound of right breast, initial encounter    Rx / DC Orders ED Discharge Orders          Ordered    doxycycline (VIBRAMYCIN) 100 MG capsule  2 times daily        05/21/22 [place], [place], PA-C 05/21/22 [place], [place], DO 05/21/22 1223

## 2022-05-24 ENCOUNTER — Ambulatory Visit
Admission: RE | Admit: 2022-05-24 | Discharge: 2022-05-24 | Disposition: A | Discharge status: Home / Self Care | Payer: Medicaid Other | Source: Ambulatory Visit | Attending: Obstetrics and Gynecology | Admitting: Obstetrics and Gynecology

## 2022-05-24 ENCOUNTER — Ambulatory Visit: Payer: Medicaid Other

## 2022-05-24 ENCOUNTER — Other Ambulatory Visit: Payer: Self-pay | Admitting: Obstetrics and Gynecology

## 2022-05-24 DIAGNOSIS — N611 Abscess of the breast and nipple: Secondary | ICD-10-CM

## 2022-08-16 ENCOUNTER — Emergency Department (HOSPITAL_COMMUNITY): Payer: BLUE CROSS/BLUE SHIELD

## 2022-08-16 ENCOUNTER — Emergency Department (HOSPITAL_COMMUNITY)
Admission: EM | Admit: 2022-08-16 | Discharge: 2022-08-16 | Disposition: A | Discharge status: Home / Self Care | Payer: BLUE CROSS/BLUE SHIELD | Attending: Emergency Medicine | Admitting: Emergency Medicine

## 2022-08-16 ENCOUNTER — Encounter (HOSPITAL_COMMUNITY): Payer: Self-pay

## 2022-08-16 DIAGNOSIS — M545 Low back pain, unspecified: Secondary | ICD-10-CM | POA: Diagnosis not present

## 2022-08-16 DIAGNOSIS — E119 Type 2 diabetes mellitus without complications: Secondary | ICD-10-CM | POA: Diagnosis not present

## 2022-08-16 DIAGNOSIS — R1032 Left lower quadrant pain: Secondary | ICD-10-CM

## 2022-08-16 DIAGNOSIS — Z7984 Long term (current) use of oral hypoglycemic drugs: Secondary | ICD-10-CM | POA: Diagnosis not present

## 2022-08-16 DIAGNOSIS — K5900 Constipation, unspecified: Secondary | ICD-10-CM | POA: Insufficient documentation

## 2022-08-16 DIAGNOSIS — I10 Essential (primary) hypertension: Secondary | ICD-10-CM | POA: Insufficient documentation

## 2022-08-16 DIAGNOSIS — R109 Unspecified abdominal pain: Secondary | ICD-10-CM

## 2022-08-16 LAB — COMPREHENSIVE METABOLIC PANEL
ALT: 24 U/L (ref 0–44)
AST: 31 U/L (ref 15–41)
Albumin: 4 g/dL (ref 3.5–5.0)
Alkaline Phosphatase: 58 U/L (ref 38–126)
Anion gap: 10 (ref 5–15)
BUN: 13 mg/dL (ref 6–20)
CO2: 23 mmol/L (ref 22–32)
Calcium: 9.5 mg/dL (ref 8.9–10.3)
Chloride: 103 mmol/L (ref 98–111)
Creatinine, Ser: 0.85 mg/dL (ref 0.44–1.00)
GFR, Estimated: >60 mL/min (ref >60)
Glucose, Bld: 141 mg/dL — ABNORMAL HIGH (ref 70–99)
Potassium: 3.8 mmol/L (ref 3.5–5.1)
Sodium: 136 mmol/L (ref 135–145)
Total Bilirubin: 0.7 mg/dL (ref 0.3–1.2)
Total Protein: 7.2 g/dL (ref 6.5–8.1)

## 2022-08-16 LAB — CBC WITH DIFFERENTIAL/PLATELET
Abs Immature Granulocytes: 0 10*3/uL (ref 0.00–0.07)
Basophils Absolute: 0 10*3/uL (ref 0.0–0.1)
Basophils Relative: 1 %
Eosinophils Absolute: 0.3 10*3/uL (ref 0.0–0.5)
Eosinophils Relative: 6 %
HCT: 39.1 % (ref 36.0–46.0)
Hemoglobin: 12.6 g/dL (ref 12.0–15.0)
Immature Granulocytes: 0 %
Lymphocytes Relative: 45 %
Lymphs Abs: 2 10*3/uL (ref 0.7–4.0)
MCH: 29.2 pg (ref 26.0–34.0)
MCHC: 32.2 g/dL (ref 30.0–36.0)
MCV: 90.5 fL (ref 80.0–100.0)
Monocytes Absolute: 0.4 10*3/uL (ref 0.1–1.0)
Monocytes Relative: 10 %
Neutro Abs: 1.7 10*3/uL (ref 1.7–7.7)
Neutrophils Relative %: 38 %
Platelets: 271 10*3/uL (ref 150–400)
RBC: 4.32 MIL/uL (ref 3.87–5.11)
RDW: 13.2 % (ref 11.5–15.5)
WBC: 4.5 10*3/uL (ref 4.0–10.5)
nRBC: 0 % (ref 0.0–0.2)

## 2022-08-16 LAB — URINALYSIS, W/ REFLEX TO CULTURE (INFECTION SUSPECTED)
Bilirubin Urine: NEGATIVE
Glucose, UA: NEGATIVE mg/dL
Hgb urine dipstick: NEGATIVE
Ketones, ur: NEGATIVE mg/dL
Nitrite: NEGATIVE
Protein, ur: NEGATIVE mg/dL
Specific Gravity, Urine: 1.009 (ref 1.005–1.030)
pH: 5 (ref 5.0–8.0)

## 2022-08-16 LAB — LACTIC ACID, PLASMA: Lactic Acid, Venous: 1.6 mmol/L (ref 0.5–1.9)

## 2022-08-16 MED ORDER — FENTANYL CITRATE PF 50 MCG/ML IJ SOSY
50.0000 ug | PREFILLED_SYRINGE | Freq: Once | INTRAMUSCULAR | Status: AC
  Administered 2022-08-16: 50 ug via INTRAVENOUS
  Filled 2022-08-16: qty 1

## 2022-08-16 MED ORDER — LIDOCAINE 5 % EX PTCH: 1.0000 | MEDICATED_PATCH | CUTANEOUS | 0 refills | Status: DC

## 2022-08-16 MED ORDER — CYCLOBENZAPRINE HCL 10 MG PO TABS: 10.0000 mg | ORAL_TABLET | Freq: Two times a day (BID) | ORAL | 0 refills | Status: DC | PRN

## 2022-08-16 MED ORDER — IOHEXOL 350 MG/ML SOLN
75.0000 mL | Freq: Once | INTRAVENOUS | Status: AC | PRN
  Administered 2022-08-16: 75 mL via INTRAVENOUS

## 2022-08-16 MED ORDER — NAPROXEN 375 MG PO TABS: 375.0000 mg | ORAL_TABLET | Freq: Two times a day (BID) | ORAL | 0 refills | Status: DC

## 2022-08-16 MED ORDER — LIDOCAINE 5 % EX PTCH
1.0000 | MEDICATED_PATCH | CUTANEOUS | Status: DC
  Administered 2022-08-16: 1 via TRANSDERMAL
  Filled 2022-08-16: qty 1

## 2022-08-16 MED ORDER — KETOROLAC TROMETHAMINE 15 MG/ML IJ SOLN
15.0000 mg | Freq: Once | INTRAMUSCULAR | Status: AC
  Administered 2022-08-16: 15 mg via INTRAVENOUS
  Filled 2022-08-16: qty 1

## 2022-08-16 MED ORDER — CYCLOBENZAPRINE HCL 10 MG PO TABS
5.0000 mg | ORAL_TABLET | Freq: Once | ORAL | Status: AC
  Administered 2022-08-16: 5 mg via ORAL
  Filled 2022-08-16: qty 1

## 2022-08-16 MED ORDER — LACTATED RINGERS IV BOLUS
1000.0000 mL | Freq: Once | INTRAVENOUS | Status: AC
  Administered 2022-08-16: 1000 mL via INTRAVENOUS

## 2022-08-16 MED ORDER — FOSFOMYCIN TROMETHAMINE 3 G PO PACK
3.0000 g | PACK | Freq: Once | ORAL | Status: AC
  Administered 2022-08-16: 3 g via ORAL
  Filled 2022-08-16: qty 3

## 2022-08-16 NOTE — ED Notes (Addendum)
Pt ambulated to restroom before triage could be performed. Provided cup for UA

## 2022-08-16 NOTE — ED Triage Notes (Signed)
Pt c/o left flank pain x 1 month that radiates to her side. Pt describes pain as a bowling ball.  Denies any pain or burning with urination.

## 2022-08-16 NOTE — Discharge Instructions (Addendum)
Recommend you trial a course of MiraLAX for constipation.  Your workup today was overall reassuring.  Your symptoms are consistent with likely musculoskeletal back pain.  You were given a one-time antibiotic dose to treat for potential early UTI.  There is no evidence of kidney infection and your vitals are stable.  We will treat you for musculoskeletal back pain with a course of Flexeril, recommend NSAIDs as well for pain control, follow-up outpatient with your PCP, return to the ER if you have persistent symptoms and are unable to establish close follow-up with the PCP. Your CT Results: IMPRESSION:  1. No acute abdominopelvic findings.  2. Moderate volume stool throughout the colon. Correlate for  constipation.  3. Enlarged, multifibroid uterus.  4. Hepatic steatosis.  5. Aortic atherosclerosis (ICD10-I70.0).

## 2022-08-16 NOTE — ED Provider Notes (Signed)
[place] EMERGENCY DEPARTMENT AT [facility] Provider Note   CSN: [id number] Arrival date & time: 08/16/22  0947     History  Chief Complaint  Patient presents with   Flank Pain    Vickie Marshall is a 57 y.o. female.  HPI   58 year old female with medical history significant for DM2, HTN, anxiety, depression, psychologic conversion disorder who presents to the emergency department with roughly 1 month of left lower quadrant pain and left flank pain.  She denies any fevers or chills, denies any dysuria or increased urinary frequency.  She is concerned that she is experiencing symptoms of UTI and kidney infection.  She denies any nausea or vomiting.  Her last bowel movement was yesterday and was loose.  She is passing gas.   Home Medications Prior to Admission medications   Medication Sig Start Date End Date Taking? Authorizing Provider  cyclobenzaprine (FLEXERIL) 10 MG tablet Take 1 tablet (10 mg total) by mouth 2 (two) times daily as needed for muscle spasms. 08/16/22  Yes Ernie Avena, MD  lidocaine (LIDODERM) 5 % Place 1 patch onto the skin daily. Remove & Discard patch within 12 hours or as directed by MD 08/16/22  Yes Ernie Avena, MD  naproxen (NAPROSYN) 375 MG tablet Take 1 tablet (375 mg total) by mouth 2 (two) times daily. 08/16/22  Yes Ernie Avena, MD  cetirizine (ZYRTEC) 10 MG tablet Take 1 tablet (10 mg total) by mouth daily. (May buy from over the counter): For allergies 01/17/18   Armandina Stammer I, NP  Dextromethorphan HBr 15 MG CAPS Take 2 capsules (30 mg total) by mouth every 8 (eight) hours. 08/12/21   Rising, Lurena Joiner, PA-C  diclofenac Sodium (VOLTAREN) 1 % GEL Apply 2 g topically daily as needed (pain).    [provider]  diphenhydrAMINE (BENADRYL) 25 MG tablet Take 1 tablet (25 mg total) by mouth at bedtime. 08/12/21   Rising, Lurena Joiner, PA-C  doxycycline (VIBRAMYCIN) 100 MG capsule Take 1 capsule (100 mg total) by mouth 2 (two) times daily. 05/21/22    Marita Kansas, PA-C  FLUoxetine (PROZAC) 40 MG capsule Take 40 mg by mouth daily.    [provider]  guaiFENesin (MUCINEX) 600 MG 12 hr tablet Take 1 tablet (600 mg total) by mouth daily. 08/12/21   Rising, Lurena Joiner, PA-C  ibuprofen (ADVIL) 200 MG tablet Take 400 mg by mouth every 6 (six) hours as needed for headache or mild pain.    [provider]  lisinopril-hydrochlorothiazide (PRINZIDE,ZESTORETIC) 20-25 MG tablet Take 1 tablet by mouth daily. For high blood pressure 01/17/18   Nwoko, Nicole Kindred I, NP  Melatonin 10 MG TABS Take 10 mg by mouth at bedtime as needed (sleep).    [provider]  metFORMIN (GLUCOPHAGE) 500 MG tablet Take 1 tablet (500 mg total) by mouth 2 (two) times daily with a meal. For diabetes management 01/17/18   Armandina Stammer I, NP  Multiple Vitamin (MULTIVITAMIN) capsule Take 1 capsule by mouth daily.    [provider]  omeprazole (PRILOSEC OTC) 20 MG tablet Take 20 mg by mouth 2 (two) times a week.    [provider]      Allergies    Asparagus, Codeine, and Egg-derived products    Review of Systems   Review of Systems  All other systems reviewed and are negative.   Physical Exam Updated Vital Signs BP 105/77   Pulse 70   Temp 98.5 F (36.9 C)   Resp 15  Ht 5' (1.524 m)   Wt 75.8 kg   LMP 04/18/2017   SpO2 100%   BMI 32.61 kg/m  Physical Exam Vitals and nursing note reviewed.  Constitutional:      General: She is not in acute distress.    Appearance: She is well-developed.  HENT:     Head: Normocephalic and atraumatic.  Eyes:     Conjunctiva/sclera: Conjunctivae normal.  Cardiovascular:     Rate and Rhythm: Normal rate and regular rhythm.  Pulmonary:     Effort: Pulmonary effort is normal. No respiratory distress.     Breath sounds: Normal breath sounds.  Abdominal:     Palpations: Abdomen is soft.     Tenderness: There is abdominal tenderness in the left lower quadrant. There is no right CVA tenderness,  left CVA tenderness, guarding or rebound.  Musculoskeletal:        General: No swelling.     Cervical back: Neck supple.     Comments: Left flank tenderness to palpation without clear CVA tenderness, no rash, no erythema or warmth, no midline tenderness of the thoracic or lumbar spine  Skin:    General: Skin is warm and dry.     Capillary Refill: Capillary refill takes less than 2 seconds.  Neurological:     General: No focal deficit present.     Mental Status: She is alert. Mental status is at baseline.     Cranial Nerves: No cranial nerve deficit.     Sensory: No sensory deficit.     Motor: No weakness.  Psychiatric:        Mood and Affect: Mood normal.     ED Results / Procedures / Treatments   Labs (all labs ordered are listed, but only abnormal results are displayed) Labs Reviewed  COMPREHENSIVE METABOLIC PANEL - Abnormal; Notable for the following components:      Result Value   Glucose, Bld 141 (*)    All other components within normal limits  URINALYSIS, W/ REFLEX TO CULTURE (INFECTION SUSPECTED) - Abnormal; Notable for the following components:   Leukocytes,Ua LARGE (*)    Bacteria, UA FEW (*)    Non Squamous Epithelial 0-5 (*)    All other components within normal limits  CULTURE, BLOOD (ROUTINE X 2)  CULTURE, BLOOD (ROUTINE X 2)  LACTIC ACID, PLASMA  CBC WITH DIFFERENTIAL/PLATELET    EKG EKG Interpretation  Date/Time:  Tuesday August 16 2022 11:14:56 EDT Ventricular Rate:  71 PR Interval:  184 QRS Duration: 85 QT Interval:  398 QTC Calculation: 433 R Axis:   26 Text Interpretation: Sinus rhythm No significant change since last tracing Reconfirmed by Ernie Avena ([id number]) on 08/16/2022 11:49:17 AM  Radiology CT ABDOMEN PELVIS W CONTRAST  Result Date: 08/16/2022 CLINICAL DATA:  Left lower quadrant pain EXAM: CT ABDOMEN AND PELVIS WITH CONTRAST TECHNIQUE: Multidetector CT imaging of the abdomen and pelvis was performed using the standard protocol following  bolus administration of intravenous contrast. RADIATION DOSE REDUCTION: This exam was performed according to the departmental dose-optimization program which includes automated exposure control, adjustment of the mA and/or kV according to patient size and/or use of iterative reconstruction technique. CONTRAST:  75mL OMNIPAQUE IOHEXOL 350 MG/ML SOLN COMPARISON:  None Available. FINDINGS: Lower chest: No acute abnormality. Hepatobiliary: Diffusely decreased attenuation of the hepatic parenchyma. No focal liver lesion identified. Prior cholecystectomy. No intrahepatic biliary dilatation. Pancreas: Unremarkable. No pancreatic ductal dilatation or surrounding inflammatory changes. Spleen: Normal in size without focal abnormality. Adrenals/Urinary Tract: Unremarkable adrenal  glands. Kidneys enhance symmetrically without focal lesion, stone, or hydronephrosis. Ureters are nondilated. Urinary bladder appears unremarkable for the degree of distention. Stomach/Bowel: Stomach is within normal limits. Appendix appears normal (series 6, image 54). No evidence of bowel wall thickening, distention, or inflammatory changes. Moderate volume stool within the colon. Vascular/Lymphatic: Minimal scattered abdominal aortic atherosclerotic calcifications without aneurysm. No abdominopelvic lymphadenopathy. Reproductive: Enlarged multifibroid uterus including dominant 5.7 cm partially calcified fibroid. No adnexal masses are evident. Other: No free fluid. No abdominopelvic fluid collection. No pneumoperitoneum. No abdominal wall hernia. Musculoskeletal: S-shaped thoracolumbar scoliotic curvature. Lower lumbar facet arthropathy. No acute bony abnormality. IMPRESSION: 1. No acute abdominopelvic findings. 2. Moderate volume stool throughout the colon. Correlate for constipation. 3. Enlarged, multifibroid uterus. 4. Hepatic steatosis. 5. Aortic atherosclerosis (ICD10-I70.0). Electronically Signed   By: Duanne Guess D.O.   On: 08/16/2022  14:24    Procedures Procedures    Medications Ordered in ED Medications  lidocaine (LIDODERM) 5 % 1 patch (has no administration in time range)  cyclobenzaprine (FLEXERIL) tablet 5 mg (has no administration in time range)  ketorolac (TORADOL) 15 MG/ML injection 15 mg (has no administration in time range)  fosfomycin (MONUROL) packet 3 g (has no administration in time range)  lactated ringers bolus 1,000 mL (0 mLs Intravenous Stopped 08/16/22 1243)  fentaNYL (SUBLIMAZE) injection 50 mcg (50 mcg Intravenous Given 08/16/22 1051)  iohexol (OMNIPAQUE) 350 MG/ML injection 75 mL (75 mLs Intravenous Contrast Given 08/16/22 1230)    ED Course/ Medical Decision Making/ A&P Clinical Course as of 08/16/22 1458  Tue Aug 16, 2022  1433 Leukocytes,Ua(!): LARGE [JL]  1433 WBC, UA: 6-10 [JL]  1433 Bacteria, UA(!): FEW [JL]    Clinical Course User Index [JL] Ernie Avena, MD                             Medical Decision Making Amount and/or Complexity of Data Reviewed Labs: ordered. Decision-making details documented in ED Course. Radiology: ordered. ECG/medicine tests: ordered.  Risk Prescription drug management.    57 year old female with medical history significant for DM2, HTN, anxiety, depression, psychologic conversion disorder who presents to the emergency department with roughly 1 month of left lower quadrant pain and left flank pain.  She denies any fevers or chills, denies any dysuria or increased urinary frequency.  She is concerned that she is experiencing symptoms of UTI and kidney infection.  She denies any nausea or vomiting.  Her last bowel movement was yesterday and was loose.  She is passing gas.   Medical Decision Making:   Vickie Marshall is a 57 y.o. female who presented to the ED today with abdominal pain, detailed above.    Patient placed on continuous vitals and telemetry monitoring while in ED which was reviewed periodically.  Complete initial physical exam performed,  notably the patient  was tender in the LLQ without rebound or guarding.  Some left flank tenderness without clear CVA tenderness, no rash, erythema or warmth to suggest infection or shingles.  Reviewed and confirmed nursing documentation for past medical history, family history, social history.    Initial Assessment:   With the patient's presentation of abdominal pain, most likely diagnosis is diverticulitis. Other diagnoses were considered including (but not limited to) gastroenteritis, colitis, small bowel obstruction, appendicitis, cholecystitis, pancreatitis, nephrolithiasis, UTI, pyleonephritis. These are considered less likely due to history of present illness and physical exam findings.   This is most consistent with an acute  complicated illness   Initial Plan:  CBC/CMP to evaluate for underlying infectious/metabolic etiology for patient's abdominal pain  Lipase to evaluate for pancreatitis  EKG to evaluate for cardiac source of pain  CTAB/Pelvis with contrast to evaluate for structural/surgical etiology of patients' severe abdominal pain.  Urinalysis and repeat physical assessment to evaluate for UTI/Pyelonpehritis  Empiric management of symptoms with escalating pain control and antiemetics as needed.   Initial Study Results:   Laboratory  All laboratory results reviewed without evidence of clinically relevant pathology.   Exceptions include: Urinalysis with large leukocytes, few bacteria, 6-10 WBCs.   EKG EKG was reviewed independently. Rate, rhythm, axis, intervals all examined and without medically relevant abnormality. ST segments without concerns for elevations.    Radiology All images reviewed independently. Agree with radiology report at this time.   CT ABDOMEN PELVIS W CONTRAST  Result Date: 08/16/2022 CLINICAL DATA:  Left lower quadrant pain EXAM: CT ABDOMEN AND PELVIS WITH CONTRAST TECHNIQUE: Multidetector CT imaging of the abdomen and pelvis was performed using the  standard protocol following bolus administration of intravenous contrast. RADIATION DOSE REDUCTION: This exam was performed according to the departmental dose-optimization program which includes automated exposure control, adjustment of the mA and/or kV according to patient size and/or use of iterative reconstruction technique. CONTRAST:  75mL OMNIPAQUE IOHEXOL 350 MG/ML SOLN COMPARISON:  None Available. FINDINGS: Lower chest: No acute abnormality. Hepatobiliary: Diffusely decreased attenuation of the hepatic parenchyma. No focal liver lesion identified. Prior cholecystectomy. No intrahepatic biliary dilatation. Pancreas: Unremarkable. No pancreatic ductal dilatation or surrounding inflammatory changes. Spleen: Normal in size without focal abnormality. Adrenals/Urinary Tract: Unremarkable adrenal glands. Kidneys enhance symmetrically without focal lesion, stone, or hydronephrosis. Ureters are nondilated. Urinary bladder appears unremarkable for the degree of distention. Stomach/Bowel: Stomach is within normal limits. Appendix appears normal (series 6, image 54). No evidence of bowel wall thickening, distention, or inflammatory changes. Moderate volume stool within the colon. Vascular/Lymphatic: Minimal scattered abdominal aortic atherosclerotic calcifications without aneurysm. No abdominopelvic lymphadenopathy. Reproductive: Enlarged multifibroid uterus including dominant 5.7 cm partially calcified fibroid. No adnexal masses are evident. Other: No free fluid. No abdominopelvic fluid collection. No pneumoperitoneum. No abdominal wall hernia. Musculoskeletal: S-shaped thoracolumbar scoliotic curvature. Lower lumbar facet arthropathy. No acute bony abnormality. IMPRESSION: 1. No acute abdominopelvic findings. 2. Moderate volume stool throughout the colon. Correlate for constipation. 3. Enlarged, multifibroid uterus. 4. Hepatic steatosis. 5. Aortic atherosclerosis (ICD10-I70.0). Electronically Signed   By: Duanne Guess D.O.   On: 08/16/2022 14:24       Final Reassessment and Plan:   On repeat assessment, the patient was overall well-appearing, tolerating oral intake.  She denies any dysuria or frequency.  Her symptoms been ongoing for the past month with pain in her lower back.  Symptoms are worsened with movement and she is mildly tender of the left flank.  Consider musculoskeletal strain given her reassuring evaluation.  She has mild left lower quadrant tenderness, was administered fosfomycin to treat for potential early UTI however minimal urinary symptoms at this time.  Will treat for musculoskeletal back pain with a course of Toradol, lidocaine, Flexeril.  Remainder of laboratory evaluation was reassuring, pointing away from infectious etiology.  Very low concern for pyelonephritis.  The patient is afebrile and without a leukocytosis.  No evidence of pyelonephritis by CT imaging.  Regarding the patient's back pain, she has no weakness, no numbness, no urinary or fecal incontinence, no saddle anesthesia.  Neurologically intact. No midline tenderness. The patient states that she has  poor follow-up with her PCP as she has not been able to get an appointment until August.  I recommended that the patient follow-up in the ER as needed if she continues to experience symptoms.  Will treat for musculoskeletal back pain with a course of Flexeril, recommended NSAIDs for pain control.  Overall stable and neurologically intact on repeat assessment, stable for discharge.   Final Clinical Impression(s) / ED Diagnoses Final diagnoses:  Left lower quadrant abdominal pain  Constipation, unspecified constipation type  Left-sided low back pain without sciatica, unspecified chronicity  Flank pain    Rx / DC Orders ED Discharge Orders          Ordered    cyclobenzaprine (FLEXERIL) 10 MG tablet  2 times daily PRN        08/16/22 1452    naproxen (NAPROSYN) 375 MG tablet  2 times daily        08/16/22 1452     lidocaine (LIDODERM) 5 %  Every 24 hours        08/16/22 1452              Ernie Avena, MD 08/16/22 1458

## 2022-08-16 NOTE — ED Notes (Signed)
DC instructions reviewed with pt. PT verbalized understanding. PT DC °

## 2022-08-19 LAB — CULTURE, BLOOD (ROUTINE X 2): Culture: NO GROWTH

## 2022-08-20 LAB — CULTURE, BLOOD (ROUTINE X 2)

## 2022-08-21 LAB — CULTURE, BLOOD (ROUTINE X 2)

## 2022-10-10 DIAGNOSIS — E1165 Type 2 diabetes mellitus with hyperglycemia: Secondary | ICD-10-CM | POA: Diagnosis not present

## 2022-10-10 DIAGNOSIS — Z13220 Encounter for screening for lipoid disorders: Secondary | ICD-10-CM | POA: Diagnosis not present

## 2022-10-10 DIAGNOSIS — E559 Vitamin D deficiency, unspecified: Secondary | ICD-10-CM | POA: Diagnosis not present

## 2022-10-10 DIAGNOSIS — Z79899 Other long term (current) drug therapy: Secondary | ICD-10-CM | POA: Diagnosis not present

## 2022-10-10 DIAGNOSIS — R946 Abnormal results of thyroid function studies: Secondary | ICD-10-CM | POA: Diagnosis not present

## 2022-10-11 DIAGNOSIS — F333 Major depressive disorder, recurrent, severe with psychotic symptoms: Secondary | ICD-10-CM | POA: Diagnosis not present

## 2022-10-17 DIAGNOSIS — I10 Essential (primary) hypertension: Secondary | ICD-10-CM | POA: Diagnosis not present

## 2022-10-17 DIAGNOSIS — E559 Vitamin D deficiency, unspecified: Secondary | ICD-10-CM | POA: Diagnosis not present

## 2022-10-17 DIAGNOSIS — E7800 Pure hypercholesterolemia, unspecified: Secondary | ICD-10-CM | POA: Diagnosis not present

## 2022-10-17 DIAGNOSIS — R440 Auditory hallucinations: Secondary | ICD-10-CM | POA: Diagnosis not present

## 2022-10-17 DIAGNOSIS — Z0000 Encounter for general adult medical examination without abnormal findings: Secondary | ICD-10-CM | POA: Diagnosis not present

## 2022-10-17 DIAGNOSIS — E118 Type 2 diabetes mellitus with unspecified complications: Secondary | ICD-10-CM | POA: Diagnosis not present

## 2022-10-17 DIAGNOSIS — Z862 Personal history of diseases of the blood and blood-forming organs and certain disorders involving the immune mechanism: Secondary | ICD-10-CM | POA: Diagnosis not present

## 2022-11-08 DIAGNOSIS — F333 Major depressive disorder, recurrent, severe with psychotic symptoms: Secondary | ICD-10-CM | POA: Diagnosis not present

## 2022-12-05 DIAGNOSIS — F333 Major depressive disorder, recurrent, severe with psychotic symptoms: Secondary | ICD-10-CM | POA: Diagnosis not present

## 2023-01-26 ENCOUNTER — Ambulatory Visit (HOSPITAL_COMMUNITY)
Admission: EM | Admit: 2023-01-26 | Discharge: 2023-01-26 | Disposition: A | Discharge status: Home / Self Care | Payer: 59

## 2023-01-26 DIAGNOSIS — F331 Major depressive disorder, recurrent, moderate: Secondary | ICD-10-CM | POA: Diagnosis not present

## 2023-01-26 NOTE — Progress Notes (Signed)
   01/26/23 1653  [facility] Triage Screening (Walk-ins at [facility] only)  How Did You Hear About [place]? Legal System  What Is the Reason for Your Visit/Call Today? Vickie Marshall is a 57 year old female who presents to [facility] escorted by [employer] from the [facility]. 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 She reports she has no family support at this time. She states she is working with someone at the [facility] to obtain disability. She reports she has a psychiatrist Dr.Odeh that manages her medication. She reports being prescribed Fluoxetine 40mg , Abilify 10mg , Metformin 500mg , Lisinopril10mg .She reports she is diagnosed with ADD, anxiety/depression. She states she has a therapist but cannot recall their name. She states she is seeking assistance with "getting back on track" with housing and medication managment. Pt reports passive SI this week but denies SI currently. Pt verbally contracts for safety.Pt denies HI and AVH .  How Long Has This Been Causing You Problems? <Week  Have You Recently Had Any Thoughts About Hurting Yourself? Yes  How long ago did you have thoughts about hurting yourself? passive this week  Are You Planning to Commit Suicide/Harm Yourself At This time? No  Have you Recently Had Thoughts About Hurting Someone Karolee Ohs? No  Are You Planning To Harm Someone At This Time? No  Are you currently experiencing any auditory, visual or other hallucinations? No  Have You Used Any Alcohol or Drugs in the Past 24 Hours? No  Do you have any current medical co-morbidities that require immediate attention? No  Clinician description of patient physical appearance/behavior: calm, cooperative  What Do You Feel Would Help You the Most Today? Treatment for Depression or other mood problem  If access to [facility] was not available, would you have sought care in the Emergency Department? No  Determination  of Need Routine (7 days)  Options For Referral Outpatient Therapy;Medication Management

## 2023-01-26 NOTE — ED Notes (Signed)
Discharge by provider

## 2023-01-26 NOTE — ED Provider Notes (Signed)
Behavioral Health Urgent Care Medical Screening Exam  Patient Name: Vickie Marshall MRN: [id number] Date of Evaluation: 01/26/23 Chief Complaint:   Diagnosis:  Final diagnoses:  Moderate episode of recurrent major depressive disorder (HCC)    History of Present illness: Vickie Marshall is a 57 y.o. female.   Patient  presents to [facility] escorted by [employer] from the [facility], [place].  She reports that she was talking to the manager at [facility], discussing her current stressful  situation and was asked to come here for  help.  Patient  reports she lost her job recently and got evicted 2 days ago, and has been homeless ever since. 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 She reports she has no family support at this time. She states she is working with someone at the [facility] to obtain disability. She reports she has a psychiatrist Dr.Odeh that manages her medication. She reports being prescribed Fluoxetine 40mg , Abilify 10mg , Metformin 500mg , Lisinopril10mg . She reports that she is diagnosed with ADD, anxiety/depression. She states she has a therapist but cannot recall their name. is week but denies SI currently. Pt verbally contracts for safety. Pt denies SI/HI/AVH. Patient is currently homeless with no support around. She denies substance use and states she has not eaten since Monday.   Assessment: 57 year old who is casually dressed and groomed. She is cooperative upon approach. Alert and oriented x 4. She denies SI/HI/AVH. Reports that she expressed her feelings when talking to the manager at [facility] and it was recommended that she should come here  "they said you can help me with my homelessness". When asked is she expressed self-harm thoughts, patient reports that "I just told her that I was going through a lot, I am stressed because I have nowhere to live, and they told me you can help me". Patient reports that  she has been trying to find shelters around with no luck. She reports that she lost all her  medications when she was evicted and has to schedule another appointment with her provider for med management. Patient denies medical issues. She denies respiratory distress. Denies chest/back pain. Denies headache/dizziness. Denies abdominal discomfort. She denies using drugs and reports that her primary concern now is housing.   Patient does not appear to be in any psychiatric crisis. She reports that she usually stays somewhere at [facility] and request a bus pass  to go there. She is planning to call her provider in AM to schedule an appointment for medication management. Patient is given resources for shelters and food services. She denies SI upon discharge.       Flowsheet Row ED from 01/26/2023 in [facility] ED from 08/16/2022 in [facility] Emergency Department at [facility] ED from 05/21/2022 in [facility] Emergency Department at [facility]  C-SSRS RISK CATEGORY Low Risk No Risk No Risk       Psychiatric Specialty Exam  Presentation  General Appearance:Appropriate for Environment; Casual  Eye Contact:Fair  Speech:Clear and Coherent  Speech Volume:Normal  Handedness:Right   Mood and Affect  Mood: Anxious; Depressed  Affect: Depressed   Thought Process  Thought Processes: Coherent  Descriptions of Associations:Intact  Orientation:Full (Time, Place and Person)  Thought Content:Logical  Diagnosis of Schizophrenia or Schizoaffective disorder in past: No data recorded  Hallucinations:None  Ideas of Reference:None  Suicidal Thoughts:No  Homicidal Thoughts:No   Sensorium  Memory: Immediate Fair; Remote Fair; Recent Fair  Judgment: Fair  Insight: Fair  Executive Functions  Concentration: Fair  Attention Span: Fair  Recall: [employer] of Knowledge: Fair  Language: Fair   Psychomotor Activity  Psychomotor Activity: Normal   Assets  Assets: Manufacturing systems engineer; Desire for  Improvement; Physical Health   Sleep  Sleep: Poor (I am homeless)  Number of hours: No data recorded  Physical Exam: Physical Exam Vitals and nursing note reviewed.  Constitutional:      Appearance: Normal appearance.  HENT:     Head: Normocephalic and atraumatic.     Right Ear: Tympanic membrane normal.     Left Ear: Tympanic membrane normal.     Nose: Nose normal.     Mouth/Throat:     Mouth: Mucous membranes are moist.  Eyes:     Extraocular Movements: Extraocular movements intact.     Pupils: Pupils are equal, round, and reactive to light.  Cardiovascular:     Rate and Rhythm: Normal rate.     Pulses: Normal pulses.  Pulmonary:     Effort: Pulmonary effort is normal.  Musculoskeletal:        General: Normal range of motion.     Cervical back: Normal range of motion and neck supple.  Neurological:     General: No focal deficit present.     Mental Status: She is alert and oriented to person, place, and time.  Psychiatric:        Thought Content: Thought content normal.    Review of Systems  Constitutional: Negative.   HENT: Negative.    Eyes: Negative.   Respiratory: Negative.    Cardiovascular: Negative.   Gastrointestinal: Negative.   Genitourinary: Negative.   Musculoskeletal: Negative.   Skin: Negative.   Neurological: Negative.   Endo/Heme/Allergies: Negative.   Psychiatric/Behavioral:  Positive for depression. The patient is nervous/anxious.    Blood pressure (!) 144/86, pulse 70, temperature 97.8 F (36.6 C), temperature source Oral, resp. rate 17, last menstrual period 04/18/2017, SpO2 99%. There is no height or weight on file to calculate BMI.  Musculoskeletal: Strength & Muscle Tone: within normal limits Gait & Station: normal Patient leans: N/A   [facility] MSE Discharge Disposition for Follow up and Recommendations: Based on my evaluation the patient does not appear to have an emergency medical condition and can be discharged with resources and  follow up care in outpatient services for Medication Management, Individual Therapy, and Group Therapy   Olin Pia, NP 01/26/2023, 5:28 PM

## 2023-01-26 NOTE — Discharge Instructions (Signed)

## 2023-01-31 DIAGNOSIS — F333 Major depressive disorder, recurrent, severe with psychotic symptoms: Secondary | ICD-10-CM | POA: Diagnosis not present

## 2023-02-09 ENCOUNTER — Encounter: Payer: Self-pay | Admitting: Physician Assistant

## 2023-02-09 ENCOUNTER — Other Ambulatory Visit: Payer: Self-pay

## 2023-02-09 ENCOUNTER — Other Ambulatory Visit: Payer: Self-pay | Admitting: Physician Assistant

## 2023-02-09 ENCOUNTER — Other Ambulatory Visit (HOSPITAL_COMMUNITY): Payer: Self-pay

## 2023-02-09 MED ORDER — METFORMIN HCL 500 MG PO TABS
500.0000 mg | ORAL_TABLET | Freq: Two times a day (BID) | ORAL | 0 refills | Status: DC
  Filled 2023-02-09: qty 60, 30d supply, fill #0

## 2023-02-09 MED ORDER — FLUOXETINE HCL 40 MG PO CAPS
40.0000 mg | ORAL_CAPSULE | Freq: Every day | ORAL | 0 refills | Status: DC
  Filled 2023-02-09 – 2023-07-07 (×2): qty 30, 30d supply, fill #0

## 2023-02-09 MED ORDER — ARIPIPRAZOLE 10 MG PO TABS
10.0000 mg | ORAL_TABLET | Freq: Every day | ORAL | 0 refills | Status: DC
  Filled 2023-02-09: qty 30, 30d supply, fill #0

## 2023-02-09 NOTE — Progress Notes (Signed)
Pt is concerned because she is not able to afford her medications.   She has a wound on the bottom of her R breast. It has been coming and going for 3 years.  It starts with a rash,the rash is continuous, but the wounds come and go. she does not know what makes it go away. It seems to come at random, does not know what starts it. She has seen multiple Drs for this, no clear diagnosis. Was using powder every day w/ oatmeal, but that may encourage it, she was asked to stop this.   She has 2 lesions, raw centers, circular and about 3 cm in diameter. They are itching and painful. She has another, smaller lesion on the L breast that is not so large and not through all skin layers. She was given some tegaderm for protection and clotrimazole otc to see if that helps.   She is a diabetic. Takes metformin 500 mg every day. CBG 125  She is out of the Prozac and Abilify, she has refills at [facility], but does not have the $11 it takes to pick them up. Those were sent to the [facility] on [facility]  The pharmacy was called, she wishes to transfer the lisinopril/hydrochlorothiazide and metformin to [facility] [address] as well.   Theodore Demark, PA-C 02/09/2023 10:48 AM

## 2023-02-10 ENCOUNTER — Other Ambulatory Visit (HOSPITAL_COMMUNITY): Payer: Self-pay

## 2023-02-10 MED ORDER — METFORMIN HCL 500 MG PO TABS
500.0000 mg | ORAL_TABLET | Freq: Two times a day (BID) | ORAL | 0 refills | Status: DC
  Filled 2023-02-10: qty 60, 30d supply, fill #0

## 2023-02-13 ENCOUNTER — Other Ambulatory Visit (HOSPITAL_COMMUNITY): Payer: Self-pay

## 2023-02-13 MED ORDER — ARIPIPRAZOLE 5 MG PO TABS
5.0000 mg | ORAL_TABLET | Freq: Every morning | ORAL | 1 refills | Status: AC
  Filled 2023-02-13: qty 30, 30d supply, fill #0

## 2023-02-13 MED ORDER — FLUOXETINE HCL 40 MG PO CAPS
40.0000 mg | ORAL_CAPSULE | Freq: Every day | ORAL | 1 refills | Status: DC
  Filled 2023-02-13: qty 30, 30d supply, fill #0

## 2023-02-13 MED ORDER — LISINOPRIL-HYDROCHLOROTHIAZIDE 20-25 MG PO TABS
1.0000 | ORAL_TABLET | Freq: Every day | ORAL | 1 refills | Status: DC
  Filled 2023-02-13: qty 30, 30d supply, fill #0
  Filled 2023-03-29: qty 30, 30d supply, fill #1
  Filled 2023-05-02: qty 30, 30d supply, fill #2
  Filled 2023-05-29: qty 30, 30d supply, fill #3
  Filled 2023-07-07: qty 30, 30d supply, fill #4
  Filled 2023-08-09: qty 30, 30d supply, fill #5

## 2023-02-14 ENCOUNTER — Other Ambulatory Visit (HOSPITAL_COMMUNITY): Payer: Self-pay

## 2023-02-16 DIAGNOSIS — F333 Major depressive disorder, recurrent, severe with psychotic symptoms: Secondary | ICD-10-CM | POA: Diagnosis not present

## 2023-02-28 DIAGNOSIS — F419 Anxiety disorder, unspecified: Secondary | ICD-10-CM | POA: Diagnosis not present

## 2023-02-28 DIAGNOSIS — F333 Major depressive disorder, recurrent, severe with psychotic symptoms: Secondary | ICD-10-CM | POA: Diagnosis not present

## 2023-03-28 ENCOUNTER — Other Ambulatory Visit (HOSPITAL_COMMUNITY): Payer: Self-pay

## 2023-03-28 DIAGNOSIS — F419 Anxiety disorder, unspecified: Secondary | ICD-10-CM | POA: Diagnosis not present

## 2023-03-28 DIAGNOSIS — F333 Major depressive disorder, recurrent, severe with psychotic symptoms: Secondary | ICD-10-CM | POA: Diagnosis not present

## 2023-03-28 MED ORDER — ARIPIPRAZOLE 10 MG PO TABS
10.0000 mg | ORAL_TABLET | Freq: Every morning | ORAL | 1 refills | Status: AC
  Filled 2023-03-28: qty 30, 30d supply, fill #0

## 2023-03-28 MED ORDER — FLUOXETINE HCL 40 MG PO CAPS
40.0000 mg | ORAL_CAPSULE | Freq: Every day | ORAL | 1 refills | Status: DC
  Filled 2023-03-28: qty 30, 30d supply, fill #0
  Filled 2023-05-02: qty 30, 30d supply, fill #1

## 2023-03-29 ENCOUNTER — Other Ambulatory Visit (HOSPITAL_COMMUNITY): Payer: Self-pay

## 2023-05-03 ENCOUNTER — Other Ambulatory Visit (HOSPITAL_COMMUNITY): Payer: Self-pay

## 2023-05-22 ENCOUNTER — Other Ambulatory Visit (HOSPITAL_COMMUNITY): Payer: Self-pay

## 2023-05-22 MED ORDER — ARIPIPRAZOLE 10 MG PO TABS
10.0000 mg | ORAL_TABLET | Freq: Every morning | ORAL | 1 refills | Status: AC
  Filled 2023-05-22: qty 30, 30d supply, fill #0
  Filled 2023-07-07: qty 30, 30d supply, fill #1

## 2023-05-22 MED ORDER — FLUOXETINE HCL 40 MG PO CAPS
40.0000 mg | ORAL_CAPSULE | Freq: Every day | ORAL | 1 refills | Status: DC
  Filled 2023-05-22 – 2023-05-29 (×2): qty 30, 30d supply, fill #0

## 2023-05-29 ENCOUNTER — Encounter (HOSPITAL_COMMUNITY): Payer: Self-pay

## 2023-05-29 ENCOUNTER — Other Ambulatory Visit (HOSPITAL_COMMUNITY): Payer: Self-pay

## 2023-05-29 ENCOUNTER — Ambulatory Visit (HOSPITAL_COMMUNITY)
Admission: EM | Admit: 2023-05-29 | Discharge: 2023-05-29 | Disposition: A | Discharge status: Home / Self Care | Attending: Family Medicine | Admitting: Family Medicine

## 2023-05-29 DIAGNOSIS — J029 Acute pharyngitis, unspecified: Secondary | ICD-10-CM | POA: Diagnosis present

## 2023-05-29 LAB — POCT RAPID STREP A (OFFICE): Rapid Strep A Screen: NEGATIVE

## 2023-05-29 NOTE — ED Provider Notes (Signed)
  [facility]   [id number] 05/29/23 Arrival Time: 4098  ASSESSMENT & PLAN:  1. Sore throat     No signs of peritonsillar abscess. Discussed.  Results for orders placed or performed during the hospital encounter of 05/29/23  POC rapid strep A   Collection Time: 05/29/23  9:58 AM  Result Value Ref Range   Rapid Strep A Screen Negative Negative   Labs Reviewed  CULTURE, GROUP A STREP [facility])  POCT RAPID STREP A (OFFICE)   OTC analgesics and throat care as needed   Discharge Instructions      You may use over the counter ibuprofen or acetaminophen as needed.  For a sore throat, over the counter products such as Colgate Peroxyl Mouth Sore Rinse or Chloraseptic Sore Throat Spray may provide some temporary relief. Your rapid strep test was negative today. We have sent your throat swab for culture and will let you know of any positive results.    Reviewed expectations re: course of current medical issues. Questions answered. Outlined signs and symptoms indicating need for more acute intervention. Patient verbalized understanding. After Visit Summary given.   SUBJECTIVE:  Vickie Marshall is a 58 y.o. female who reports a sore throat. Abrupt onset; this am. Denies fever. Denies resp symptoms. Otherwise well. Tolerating PO intake.    OBJECTIVE:  Vitals:   05/29/23 0926  BP: 131/78  Pulse: 85  Resp: 16  Temp: 98.1 F (36.7 C)  TempSrc: Oral  SpO2: 97%     General appearance: alert; no distress HEENT: throat with moderate erythema; uvula is midline Neck: supple with FROM; no lymphadenopathy Lungs: speaks full sentences without difficulty; unlabored Abd: soft; non-tender Skin: reveals no rash; warm and dry Psychological: alert and cooperative; normal mood and affect  Allergies  Allergen Reactions   Asparagus Nausea Only   Codeine Nausea Only and Other (See Comments)    dizziness   Egg-Derived Products Nausea Only    Past Medical History:   Diagnosis Date   Anxiety    Diabetes mellitus without complication (HCC)    Hypertension    Urinary tract infection 08/2017   Social History   Socioeconomic History   Marital status: Single    Spouse name: Not on file   Number of children: Not on file   Years of education: Not on file   Highest education level: Not on file  Occupational History   Not on file  Tobacco Use   Smoking status: Never   Smokeless tobacco: Never  Vaping Use   Vaping status: Never Used  Substance and Sexual Activity   Alcohol use: Not Currently   Drug use: No   Sexual activity: Not Currently  Other Topics Concern   Not on file  Social History Narrative   ** Merged History Encounter **       Social Drivers of Health   Financial Resource Strain: Not on file  Food Insecurity: Not on file  Transportation Needs: Not on file  Physical Activity: Not on file  Stress: Not on file  Social Connections: Not on file  Intimate Partner Violence: Not on file   Family History  Problem Relation Age of Onset   Diabetes Mother    Arthritis Mother    Heart disease Mother    Cancer Mother    Hyperlipidemia Mother    COPD Father            Mardella Layman, MD 05/29/23 [phone]

## 2023-05-29 NOTE — ED Triage Notes (Signed)
 Patient states sore throat and she is coughing up bloody mucous X 2 DAYS.

## 2023-05-29 NOTE — Discharge Instructions (Signed)
 You may use over the counter ibuprofen or acetaminophen as needed.  For a sore throat, over the counter products such as Colgate Peroxyl Mouth Sore Rinse or Chloraseptic Sore Throat Spray may provide some temporary relief. Your rapid strep test was negative today. We have sent your throat swab for culture and will let you know of any positive results.

## 2023-05-31 ENCOUNTER — Other Ambulatory Visit (HOSPITAL_COMMUNITY): Payer: Self-pay

## 2023-05-31 ENCOUNTER — Ambulatory Visit (HOSPITAL_COMMUNITY)
Admission: EM | Admit: 2023-05-31 | Discharge: 2023-05-31 | Disposition: A | Discharge status: Home / Self Care | Attending: Nurse Practitioner | Admitting: Nurse Practitioner

## 2023-05-31 ENCOUNTER — Encounter (HOSPITAL_COMMUNITY): Payer: Self-pay | Admitting: Emergency Medicine

## 2023-05-31 ENCOUNTER — Other Ambulatory Visit: Payer: Self-pay

## 2023-05-31 DIAGNOSIS — J069 Acute upper respiratory infection, unspecified: Secondary | ICD-10-CM

## 2023-05-31 DIAGNOSIS — J029 Acute pharyngitis, unspecified: Secondary | ICD-10-CM

## 2023-05-31 LAB — CULTURE, GROUP A STREP (THRC)

## 2023-05-31 MED ORDER — AMOXICILLIN-POT CLAVULANATE 875-125 MG PO TABS
1.0000 | ORAL_TABLET | Freq: Two times a day (BID) | ORAL | 0 refills | Status: AC
  Filled 2023-05-31: qty 20, 10d supply, fill #0

## 2023-05-31 NOTE — ED Provider Notes (Signed)
 [facility]    CSN: [id number] Arrival date & time: 05/31/23  1520      History   Chief Complaint No chief complaint on file.   HPI Vickie Marshall is a 58 y.o. female.   Patient presents requesting reevaluation for sore throat that is been ongoing for 6 days along with green bloody nasal congestion.  She is also noticing some discharge coming from her right eye.  She was evaluated here in this urgent care on May 29, 2023 with negative strep testing and a throat culture.  She has been utilizing Tylenol cold and flu along with cough drops for symptom management.  Patient states she has now developed a frontal headache.  No reported fever, chest pain, shortness of breath, abdominal pain, vomiting, or diarrhea.  She is diabetic but does not routinely check her blood sugars.  The history is provided by the patient.    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      Home Medications    Prior to Admission medications   Medication Sig Start Date End Date Taking? Authorizing Provider  amoxicillin-clavulanate (AUGMENTIN) 875-125 MG tablet Take 1 tablet by mouth every 12 (twelve) hours for 10 days. 05/31/23 06/10/23 Yes Burgess Estelle, FNP  ARIPiprazole (ABILIFY) 10 MG tablet Take 1 tablet (10 mg total) by mouth every morning. 03/28/23     ARIPiprazole (ABILIFY) 10 MG tablet Take 1 tablet (10 mg total) by mouth every morning. 05/22/23      ARIPiprazole (ABILIFY) 5 MG tablet Take 1 tablet (5 mg total) by mouth every morning. 02/13/23     cetirizine (ZYRTEC) 10 MG tablet Take 1 tablet (10 mg total) by mouth daily. (May buy from over the counter): For allergies 01/17/18   Armandina Stammer I, NP  FLUoxetine (PROZAC) 40 MG capsule Take 1 capsule (40 mg total) by mouth daily. 02/09/23   Barrett, Joline Salt, PA-C  lisinopril-hydrochlorothiazide (ZESTORETIC) 20-25 MG tablet Take 1 tablet by mouth daily. 02/13/23     metFORMIN (GLUCOPHAGE) 500 MG tablet Take 1 tablet (500 mg total) by mouth 2 (two) times daily with a meal. For diabetes management 01/17/18   Armandina Stammer I, NP  Multiple Vitamin (MULTIVITAMIN) capsule Take 1 capsule by mouth daily.    [provider]    Family History Family History  Problem Relation Age of Onset   Diabetes Mother    Arthritis Mother    Heart disease Mother    Cancer Mother    Hyperlipidemia Mother    COPD Father     Social History Social History   Tobacco Use   Smoking status: Never   Smokeless tobacco: Never  Vaping Use   Vaping status: Never Used  Substance Use Topics   Alcohol use: Not Currently   Drug use: No     Allergies   Asparagus, Codeine, and Egg-derived products   Review of Systems Review  of Systems  Constitutional:  Negative for chills and fever.  HENT:  Positive for congestion, sinus pressure, sinus pain and sore throat.   Eyes:  Positive for discharge. Negative for pain, redness and itching.  Respiratory:  Negative for cough and shortness of breath.   Cardiovascular:  Negative for chest pain.  Gastrointestinal:  Negative for abdominal pain, diarrhea, nausea and vomiting.  Genitourinary: Negative.   Musculoskeletal: Negative.   Skin: Negative.   Psychiatric/Behavioral: Negative.       Physical Exam Triage Vital Signs ED Triage Vitals  Encounter Vitals Group     BP 05/31/23 1711 (!) 143/84     Systolic BP Percentile --      Diastolic BP Percentile --       Pulse Rate 05/31/23 1711 85     Resp 05/31/23 1711 18     Temp 05/31/23 1711 98.1 F (36.7 C)     Temp Source 05/31/23 1711 Oral     SpO2 05/31/23 1711 98 %     Weight --      Height --      Head Circumference --      Peak Flow --      Pain Score 05/31/23 1708 5     Pain Loc --      Pain Education --      Exclude from Growth Chart --    No data found.  Updated Vital Signs BP (!) 143/84 (BP Location: Right Arm)   Pulse 85   Temp 98.1 F (36.7 C) (Oral)   Resp 18   LMP 04/18/2017   SpO2 98%   Visual Acuity Right Eye Distance:   Left Eye Distance:   Bilateral Distance:    Right Eye Near:   Left Eye Near:    Bilateral Near:     Physical Exam Vitals and nursing note reviewed.  Constitutional:      Appearance: Normal appearance.  HENT:     Head: Normocephalic.     Right Ear: Tympanic membrane normal.     Left Ear: Tympanic membrane and ear canal normal.     Nose: Congestion present.     Right Turbinates: Enlarged.     Left Turbinates: Enlarged.     Right Sinus: Frontal sinus tenderness present.     Left Sinus: Frontal sinus tenderness present.  Eyes:     General:        Right eye: No discharge.        Left eye: No discharge.     Extraocular Movements: Extraocular movements intact.     Pupils: Pupils are equal, round, and reactive to light.  Cardiovascular:     Rate and Rhythm: Normal rate and regular rhythm.     Heart sounds: Normal heart sounds.  Pulmonary:     Effort: Pulmonary effort is normal.     Breath sounds: Normal breath sounds.  Abdominal:     General: Bowel sounds are normal.  Musculoskeletal:     Cervical back: Normal range of motion.  Skin:    General: Skin is dry.  Neurological:     General: No focal deficit present.     Mental Status: She is alert and oriented to person, place, and time.  Psychiatric:        Mood and Affect: Mood normal.        Behavior: Behavior normal.      UC Treatments / Results  Labs (all labs ordered are  listed, but only abnormal results are displayed)  Labs Reviewed - No data to display  EKG   Radiology No results found.  Procedures Procedures (including critical care time)  Medications Ordered in UC Medications - No data to display  Initial Impression / Assessment and Plan / UC Course  I have reviewed the triage vital signs and the nursing notes.  Pertinent labs & imaging results that were available during my care of the patient were reviewed by me and considered in my medical decision making (see chart for details).    Patient presenting for reevaluation of a continued sore throat, nasal congestion, and a frontal headache.  Symptom onset was 6 days ago.  Based upon her history and physical exam, reasonable to provide antibiotic therapy at this time.  A prescription for Augmentin was sent to the pharmacy on file.  Have encouraged her to use Coricidin HBP cough and cold for additional symptom management.  Return for reevaluation should symptoms persist or worsen.  Final Clinical Impressions(s) / UC Diagnoses   Final diagnoses:  Acute upper respiratory infection  Acute pharyngitis, unspecified etiology     Discharge Instructions      Will plan to treat your upper respiratory infection with Augmentin twice daily for 10 days.  May use Coricidin HBP cough and cold as needed for symptom management as well.  Ensure adequate oral hydration.     ED Prescriptions     Medication Sig Dispense Auth. Provider   amoxicillin-clavulanate (AUGMENTIN) 875-125 MG tablet Take 1 tablet by mouth every 12 (twelve) hours for 10 days. 20 tablet Burgess Estelle, FNP      PDMP not reviewed this encounter.   Burgess Estelle, FNP 05/31/23 [phone]

## 2023-05-31 NOTE — ED Triage Notes (Signed)
 Seen Monday for the same symptoms.  Sore throat, green secretions from nose and green drainage from right eye.  Reports this is the 6th day of symptoms.  No fever  Patient has been taken acetaminophen cold and flu, cough drops.

## 2023-05-31 NOTE — Discharge Instructions (Addendum)
 Will plan to treat your upper respiratory infection with Augmentin twice daily for 10 days.  May use Coricidin HBP cough and cold as needed for symptom management as well.  Ensure adequate oral hydration.

## 2023-07-05 ENCOUNTER — Other Ambulatory Visit: Payer: Self-pay | Admitting: Critical Care Medicine

## 2023-07-05 ENCOUNTER — Other Ambulatory Visit: Payer: Self-pay

## 2023-07-05 ENCOUNTER — Encounter: Payer: Self-pay | Admitting: *Deleted

## 2023-07-05 ENCOUNTER — Encounter: Payer: Self-pay | Admitting: Physician Assistant

## 2023-07-05 LAB — GLUCOSE, POCT (MANUAL RESULT ENTRY): POC Glucose: 172 mg/dL — AB (ref 70–99)

## 2023-07-05 MED ORDER — ACCU-CHEK GUIDE W/DEVICE KIT
PACK | 0 refills | Status: AC
  Filled 2023-07-05: qty 1, fill #0
  Filled 2023-07-06: qty 1, 30d supply, fill #0

## 2023-07-05 MED ORDER — ACCU-CHEK SOFTCLIX LANCETS MISC
12 refills | Status: AC
  Filled 2023-07-05: qty 100, fill #0
  Filled 2023-07-06: qty 100, 100d supply, fill #0

## 2023-07-05 MED ORDER — ACCU-CHEK GUIDE TEST VI STRP
ORAL_STRIP | 12 refills | Status: AC
  Filled 2023-07-05 – 2023-07-06 (×2): qty 100, 30d supply, fill #0

## 2023-07-05 NOTE — Congregational Nurse Program (Signed)
  Dept: [phone]   Congregational Nurse Program Note  Date of Encounter: 07/05/2023  Past Medical History: Past Medical History:  Diagnosis Date   Anxiety    Diabetes mellitus without complication (HCC)    Hypertension    Urinary tract infection 08/2017    Encounter Details:  Community Questionnaire - 07/05/23 1053       Questionnaire   Ask client: Do you give verbal consent for me to treat you today? Yes    Student Assistance N/A    Location Patient Served  [facility]    Encounter Setting CN site    Population Status Unhoused    Insurance/Financial Assistance Referral N/A    Medication N/A    Medical Provider Yes    Screening Referrals Made N/A    Medical Referrals Made [facility] PCP/Clinic    Medical Appointment Completed N/A    CNP Interventions Advocate/Support;Educate;Navigate Healthcare System    Screenings CN Performed Blood Glucose;Blood Pressure    ED Visit Averted N/A    Life-Saving Intervention Made N/A            Client signed list to see MD in [facility] clinic today. Completed triage form. Blood pressure 104/68, pulse 91, last menstrual period 04/18/2017, peak flow 94 L/min. CBG 172.Offered support, educated about normal reading for CBG and offered encouragement.  Naeemah Jasmer W RN CN

## 2023-07-05 NOTE — Progress Notes (Signed)
 Pt says that she became homeless because of her psychosis. She is on meds, not having suicidal thoughts.  Taking 10 mg Abilify , fluoxetine  40 mg for mental health issues.   She had trouble holding a job and making the rent.   Has been seen at [facility], needs to make a f/u appt >> says will do.   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 She has had it for years, sleeps w/ knees elevated. But recently the pain has been worse, it is deep on the inside. She cannot kneel.   Address in the system is her sister's.  She has DM.  She takes metformin  bid, CBG today was 172.   She would like to get a cont glucose monitor but was advised it will not be covered because she is not on insulin .   She does not have a regular glucose monitor, one was prescribed and will be given to her  Armandina Bernard, PA-C 07/05/2023 3:12 PM

## 2023-07-05 NOTE — Progress Notes (Signed)
 CBG test strips

## 2023-07-06 ENCOUNTER — Other Ambulatory Visit: Payer: Self-pay

## 2023-07-07 ENCOUNTER — Other Ambulatory Visit: Payer: Self-pay

## 2023-07-07 ENCOUNTER — Other Ambulatory Visit (HOSPITAL_COMMUNITY): Payer: Self-pay

## 2023-07-10 ENCOUNTER — Other Ambulatory Visit (HOSPITAL_COMMUNITY): Payer: Self-pay

## 2023-07-10 ENCOUNTER — Encounter (HOSPITAL_COMMUNITY): Payer: Self-pay

## 2023-07-10 MED ORDER — METFORMIN HCL 500 MG PO TABS
500.0000 mg | ORAL_TABLET | Freq: Two times a day (BID) | ORAL | 0 refills | Status: DC
  Filled 2023-07-10: qty 60, 30d supply, fill #0

## 2023-07-11 ENCOUNTER — Other Ambulatory Visit (HOSPITAL_COMMUNITY): Payer: Self-pay

## 2023-07-17 ENCOUNTER — Other Ambulatory Visit (HOSPITAL_COMMUNITY): Payer: Self-pay

## 2023-07-17 MED ORDER — FLUOXETINE HCL 40 MG PO CAPS
40.0000 mg | ORAL_CAPSULE | Freq: Every day | ORAL | 1 refills | Status: AC
  Filled 2023-07-17 – 2023-08-09 (×2): qty 30, 30d supply, fill #0
  Filled 2023-12-27: qty 30, 30d supply, fill #1
  Filled ????-??-??: fill #0

## 2023-07-17 MED ORDER — ARIPIPRAZOLE 10 MG PO TABS
10.0000 mg | ORAL_TABLET | ORAL | 1 refills | Status: AC
  Filled 2023-07-17: qty 30, 30d supply, fill #0

## 2023-08-07 ENCOUNTER — Ambulatory Visit (INDEPENDENT_AMBULATORY_CARE_PROVIDER_SITE_OTHER): Admitting: Podiatry

## 2023-08-07 ENCOUNTER — Encounter: Payer: Self-pay | Admitting: Podiatry

## 2023-08-07 DIAGNOSIS — M79674 Pain in right toe(s): Secondary | ICD-10-CM | POA: Diagnosis not present

## 2023-08-07 DIAGNOSIS — B351 Tinea unguium: Secondary | ICD-10-CM | POA: Diagnosis not present

## 2023-08-07 DIAGNOSIS — M79675 Pain in left toe(s): Secondary | ICD-10-CM | POA: Diagnosis not present

## 2023-08-07 DIAGNOSIS — E1142 Type 2 diabetes mellitus with diabetic polyneuropathy: Secondary | ICD-10-CM | POA: Diagnosis not present

## 2023-08-07 NOTE — Addendum Note (Signed)
 Addended by: Theresia Flasher on: 08/07/2023 10:43 AM   Modules accepted: Orders

## 2023-08-07 NOTE — Progress Notes (Signed)
  Subjective:  Patient ID: Vickie Marshall, female    DOB: 09/02/1965,   MRN: [id number]  Chief Complaint  Patient presents with   Nail Problem    Rm 22/ left hallux nail thick brown discoloration/ pain with pressure/diabetic last A1C7/ patient has tried otc treatment    58 y.o. female presents for concern as above and concern of thickened elongated and painful nails that are difficult to trim. Requesting to have them trimmed today. Relates burning and tingling in their feet. Patient is diabetic and last A1c was  Lab Results  Component Value Date   HGBA1C 5.8 (H) 01/14/2018   .   PCP:  Pete Brand, DO     . Denies any other pedal complaints. Denies n/v/f/c.   Past Medical History:  Diagnosis Date   Anxiety    Diabetes mellitus without complication (HCC)    Hypertension    Urinary tract infection 08/2017    Objective:  Physical Exam: Vascular: DP/PT pulses 2/4 bilateral. CFT <3 seconds. Absent hair growth on digits. Edema noted to bilateral lower extremities. Xerosis noted bilaterally.  Skin. No lacerations or abrasions bilateral feet. Nails 1-5 bilateral  are thickened discolored and elongated with subungual debris.  Musculoskeletal: MMT 5/5 bilateral lower extremities in DF, PF, Inversion and Eversion. Deceased ROM in DF of ankle joint.  Neurological: Sensation intact to light touch. Protective sensation diminished bilateral.    Assessment:   1. Pain due to onychomycosis of toenails of both feet   2. Type 2 diabetes mellitus with peripheral neuropathy (HCC)      Plan:  Patient was evaluated and treated and all questions answered. -Examined patient -Discussed treatment options for painful dystrophic nails  -Clinical picture and Fungal culture was obtained by removing a portion of the hard nail itself from each of the involved toenails using a sterile nail nipper and sent to [facility] lab. Patient tolerated the biopsy procedure well without discomfort or need for anesthesia.   -Discussed fungal nail treatment options including oral, topical, and laser treatments.  -Discussed and educated patient on diabetic foot care, especially with  regards to the vascular, neurological and musculoskeletal systems.  -Stressed the importance of good glycemic control and the detriment of not  controlling glucose levels in relation to the foot. -Discussed supportive shoes at all times and checking feet regularly.  -Mechanically debrided all nails 1-5 bilateral using sterile nail nipper and filed with dremel without incident  -Patient to return in 4 weeks for follow up evaluation and discussion of fungal culture results or sooner if symptoms worsen.   Jennefer Moats, DPM

## 2023-08-09 ENCOUNTER — Other Ambulatory Visit (HOSPITAL_COMMUNITY): Payer: Self-pay

## 2023-08-10 ENCOUNTER — Telehealth: Payer: Self-pay | Admitting: Podiatry

## 2023-08-10 ENCOUNTER — Other Ambulatory Visit (HOSPITAL_COMMUNITY): Payer: Self-pay

## 2023-08-10 NOTE — Telephone Encounter (Signed)
 As per Lamond Pilot from [facility] called requesting which foot and what location was it taken from. Contact telephone number, [phone] Ext [phone]

## 2023-08-11 ENCOUNTER — Other Ambulatory Visit (HOSPITAL_COMMUNITY): Payer: Self-pay

## 2023-08-11 MED ORDER — METFORMIN HCL 500 MG PO TABS
500.0000 mg | ORAL_TABLET | Freq: Two times a day (BID) | ORAL | 2 refills | Status: AC
  Filled 2023-08-11: qty 60, 30d supply, fill #0
  Filled 2023-09-18: qty 60, 30d supply, fill #1
  Filled 2024-01-18: qty 60, 30d supply, fill #2

## 2023-08-16 ENCOUNTER — Other Ambulatory Visit (HOSPITAL_COMMUNITY): Payer: Self-pay

## 2023-08-22 ENCOUNTER — Other Ambulatory Visit: Payer: Self-pay | Admitting: Podiatry

## 2023-09-04 ENCOUNTER — Ambulatory Visit (INDEPENDENT_AMBULATORY_CARE_PROVIDER_SITE_OTHER): Admitting: Podiatry

## 2023-09-04 DIAGNOSIS — M79675 Pain in left toe(s): Secondary | ICD-10-CM

## 2023-09-04 DIAGNOSIS — B351 Tinea unguium: Secondary | ICD-10-CM

## 2023-09-04 DIAGNOSIS — M79674 Pain in right toe(s): Secondary | ICD-10-CM | POA: Diagnosis not present

## 2023-09-04 DIAGNOSIS — E1142 Type 2 diabetes mellitus with diabetic polyneuropathy: Secondary | ICD-10-CM | POA: Diagnosis not present

## 2023-09-04 NOTE — Progress Notes (Signed)
  Subjective:  Patient ID: Vickie Marshall, female    DOB: 06-05-1965,   MRN: [id number]  Chief Complaint  Patient presents with   Nail Problem    F/U nail fungus. Left foot great toe.    58 y.o. female presents for concern as above and concern of thickened elongated and painful nails that are difficult to trim. Here to review culture results and treatments options.  Relates burning and tingling in their feet. Patient is diabetic and last A1c was  Lab Results  Component Value Date   HGBA1C 5.8 (H) 01/14/2018   .   PCP:  Gerome Brunet, DO     . Denies any other pedal complaints. Denies n/v/f/c.   Past Medical History:  Diagnosis Date   Anxiety    Diabetes mellitus without complication (HCC)    Hypertension    Urinary tract infection 08/2017    Objective:  Physical Exam: Vascular: DP/PT pulses 2/4 bilateral. CFT <3 seconds. Absent hair growth on digits. Edema noted to bilateral lower extremities. Xerosis noted bilaterally.  Skin. No lacerations or abrasions bilateral feet. Nails 1-5 bilateral  are thickened discolored and elongated with subungual debris.  Musculoskeletal: MMT 5/5 bilateral lower extremities in DF, PF, Inversion and Eversion. Deceased ROM in DF of ankle joint.  Neurological: Sensation intact to light touch. Protective sensation diminished bilateral.    Assessment:   1. Pain due to onychomycosis of toenails of both feet   2. Type 2 diabetes mellitus with peripheral neuropathy (HCC)       Plan:  Patient was evaluated and treated and all questions answered. -Examined patient -Discussed treatment options for painful dystrophic nails  -Culture negative for fungus just trauma present.  -Discussed  treatments including shoe gear modification and urea nail gel.  -Discussed and educated patient on diabetic foot care, especially with  regards to the vascular, neurological and musculoskeletal systems.  -Stressed the importance of good glycemic control and the  detriment of not  controlling glucose levels in relation to the foot. -Discussed supportive shoes at all times and checking feet regularly.  -Patient to return in 3 months for rfc.    Asberry Failing, DPM

## 2023-09-11 ENCOUNTER — Other Ambulatory Visit (HOSPITAL_COMMUNITY): Payer: Self-pay

## 2023-09-11 ENCOUNTER — Other Ambulatory Visit: Payer: Self-pay

## 2023-09-11 MED ORDER — FLUOXETINE HCL 40 MG PO CAPS
40.0000 mg | ORAL_CAPSULE | Freq: Every day | ORAL | 1 refills | Status: AC
  Filled 2023-09-11: qty 30, 30d supply, fill #0

## 2023-09-11 MED ORDER — ARIPIPRAZOLE 10 MG PO TABS
10.0000 mg | ORAL_TABLET | Freq: Every morning | ORAL | 1 refills | Status: AC
  Filled 2023-09-11: qty 30, 30d supply, fill #0

## 2023-09-18 ENCOUNTER — Other Ambulatory Visit: Payer: Self-pay

## 2023-09-18 ENCOUNTER — Other Ambulatory Visit (HOSPITAL_COMMUNITY): Payer: Self-pay

## 2023-09-19 ENCOUNTER — Other Ambulatory Visit (HOSPITAL_COMMUNITY): Payer: Self-pay

## 2023-09-19 MED ORDER — LISINOPRIL-HYDROCHLOROTHIAZIDE 20-25 MG PO TABS
1.0000 | ORAL_TABLET | Freq: Every day | ORAL | 0 refills | Status: DC
  Filled 2023-09-19: qty 90, 90d supply, fill #0

## 2023-11-03 ENCOUNTER — Other Ambulatory Visit (HOSPITAL_COMMUNITY): Payer: Self-pay

## 2023-11-03 MED ORDER — FLUOXETINE HCL 40 MG PO CAPS
40.0000 mg | ORAL_CAPSULE | Freq: Every day | ORAL | 1 refills | Status: AC
  Filled 2023-11-03 – 2023-11-27 (×2): qty 30, 30d supply, fill #0

## 2023-11-03 MED ORDER — ARIPIPRAZOLE 10 MG PO TABS
10.0000 mg | ORAL_TABLET | Freq: Every morning | ORAL | 1 refills | Status: AC
  Filled 2023-11-03 – 2023-11-27 (×2): qty 30, 30d supply, fill #0

## 2023-11-14 ENCOUNTER — Other Ambulatory Visit (HOSPITAL_COMMUNITY): Payer: Self-pay

## 2023-11-27 ENCOUNTER — Other Ambulatory Visit (HOSPITAL_COMMUNITY): Payer: Self-pay

## 2023-12-04 ENCOUNTER — Ambulatory Visit: Admitting: Podiatry

## 2023-12-04 ENCOUNTER — Encounter: Payer: Self-pay | Admitting: Podiatry

## 2023-12-04 DIAGNOSIS — B351 Tinea unguium: Secondary | ICD-10-CM

## 2023-12-04 DIAGNOSIS — E1142 Type 2 diabetes mellitus with diabetic polyneuropathy: Secondary | ICD-10-CM | POA: Diagnosis not present

## 2023-12-04 DIAGNOSIS — M79675 Pain in left toe(s): Secondary | ICD-10-CM

## 2023-12-04 DIAGNOSIS — M79674 Pain in right toe(s): Secondary | ICD-10-CM

## 2023-12-04 NOTE — Progress Notes (Signed)
  Subjective:  Patient ID: Vickie Marshall, female    DOB: 1965/06/01,   MRN: [id number]  Chief Complaint  Patient presents with   Diabetes    She is supposed to trim my nails.  I want to talk to her about the pain in my toes.  Saw Dr. Lonell Vickie - 07/08/2023; A1c - 7.?    58 y.o. female presents for concern as above and concern of thickened elongated and painful nails that are difficult to trim. Here to review culture results and treatments options.  Relates burning and tingling in their feet. Patient is diabetic and last A1c was  Lab Results  Component Value Date   HGBA1C 5.8 (H) 01/14/2018   .   PCP:  Vickie Lonell, DO     . Denies any other pedal complaints. Denies n/v/f/c.   Past Medical History:  Diagnosis Date   Anxiety    Diabetes mellitus without complication (HCC)    Hypertension    Urinary tract infection 08/2017    Objective:  Physical Exam: Vascular: DP/PT pulses 2/4 bilateral. CFT <3 seconds. Absent hair growth on digits. Edema noted to bilateral lower extremities. Xerosis noted bilaterally.  Skin. No lacerations or abrasions bilateral feet. Nails 1-5 bilateral  are thickened discolored and elongated with subungual debris.  Musculoskeletal: MMT 5/5 bilateral lower extremities in DF, PF, Inversion and Eversion. Deceased ROM in DF of ankle joint.  Neurological: Sensation intact to light touch. Protective sensation diminished bilateral.    Assessment:   1. Pain due to onychomycosis of toenails of both feet   2. Type 2 diabetes mellitus with peripheral neuropathy (HCC)       Plan:  Patient was evaluated and treated and all questions answered. -Examined patient -Discussed treatment options for painful dystrophic nails  -Culture negative for fungus just trauma present.  -Discussed  treatments including shoe gear modification and urea nail gel.  -Discussed and educated patient on diabetic foot care, especially with  regards to the vascular, neurological and  musculoskeletal systems.  -Stressed the importance of good glycemic control and the detriment of not  controlling glucose levels in relation to the foot. -Discussed supportive shoes at all times and checking feet regularly.  -Patient to return in 3 months for rfc.    Asberry Failing, DPM

## 2023-12-11 ENCOUNTER — Other Ambulatory Visit: Payer: Self-pay | Admitting: Family Medicine

## 2023-12-11 DIAGNOSIS — N644 Mastodynia: Secondary | ICD-10-CM

## 2023-12-27 ENCOUNTER — Ambulatory Visit
Admission: RE | Admit: 2023-12-27 | Discharge: 2023-12-27 | Disposition: A | Source: Ambulatory Visit | Attending: Family Medicine | Admitting: Family Medicine

## 2023-12-27 ENCOUNTER — Ambulatory Visit

## 2023-12-27 ENCOUNTER — Other Ambulatory Visit (HOSPITAL_COMMUNITY): Payer: Self-pay

## 2023-12-27 DIAGNOSIS — N644 Mastodynia: Secondary | ICD-10-CM

## 2024-01-18 ENCOUNTER — Other Ambulatory Visit (HOSPITAL_COMMUNITY): Payer: Self-pay

## 2024-01-18 MED ORDER — LISINOPRIL-HYDROCHLOROTHIAZIDE 20-25 MG PO TABS
1.0000 | ORAL_TABLET | Freq: Every day | ORAL | 2 refills | Status: AC
  Filled 2024-01-18: qty 90, 90d supply, fill #0

## 2024-02-12 ENCOUNTER — Other Ambulatory Visit (HOSPITAL_COMMUNITY): Payer: Self-pay

## 2024-02-12 MED ORDER — ARIPIPRAZOLE 10 MG PO TABS
10.0000 mg | ORAL_TABLET | Freq: Every morning | ORAL | 1 refills | Status: AC
  Filled 2024-02-12: qty 30, 30d supply, fill #0

## 2024-02-12 MED ORDER — FLUOXETINE HCL 40 MG PO CAPS
40.0000 mg | ORAL_CAPSULE | Freq: Every day | ORAL | 1 refills | Status: AC
  Filled 2024-02-12: qty 30, 30d supply, fill #0
  Filled 2024-03-21: qty 30, 30d supply, fill #1

## 2024-03-11 ENCOUNTER — Ambulatory Visit (INDEPENDENT_AMBULATORY_CARE_PROVIDER_SITE_OTHER): Admitting: Podiatry

## 2024-03-11 ENCOUNTER — Encounter: Payer: Self-pay | Admitting: Podiatry

## 2024-03-11 DIAGNOSIS — E1142 Type 2 diabetes mellitus with diabetic polyneuropathy: Secondary | ICD-10-CM

## 2024-03-11 DIAGNOSIS — M79674 Pain in right toe(s): Secondary | ICD-10-CM | POA: Diagnosis not present

## 2024-03-11 DIAGNOSIS — M79675 Pain in left toe(s): Secondary | ICD-10-CM

## 2024-03-11 DIAGNOSIS — B351 Tinea unguium: Secondary | ICD-10-CM | POA: Diagnosis not present

## 2024-03-11 NOTE — Progress Notes (Signed)
This patient returns to my office for at risk foot care.  This patient requires this care by a professional since this patient will be at risk due to having diabetic neuropathy.  This patient is unable to cut nails herself since the patient cannot reach her nails.These nails are painful walking and wearing shoes.  This patient presents for at risk foot care today.  General Appearance  Alert, conversant and in no acute stress.  Vascular  Dorsalis pedis and posterior tibial  pulses are palpable  bilaterally.  Capillary return is within normal limits  bilaterally. Temperature is within normal limits  bilaterally.  Neurologic  Senn-Weinstein monofilament wire test within normal limits  bilaterally. Muscle power within normal limits bilaterally.  Nails Thick disfigured discolored nails with subungual debris  from hallux to fifth toes bilaterally. No evidence of bacterial infection or drainage bilaterally.  Orthopedic  No limitations of motion  feet .  No crepitus or effusions noted.  No bony pathology or digital deformities noted.  Skin  normotropic skin with no porokeratosis noted bilaterally.  No signs of infections or ulcers noted.     Onychomycosis  Pain in right toes  Pain in left toes  Consent was obtained for treatment procedures.   Mechanical debridement of nails 1-5  bilaterally performed with a nail nipper.  Filed with dremel without incident.     Return office visit   3 months                   Told patient to return for periodic foot care and evaluation due to potential at risk complications.   Collen Vincent DPM   

## 2024-04-08 ENCOUNTER — Other Ambulatory Visit: Payer: Self-pay

## 2024-04-08 ENCOUNTER — Other Ambulatory Visit (HOSPITAL_COMMUNITY): Payer: Self-pay

## 2024-04-08 MED ORDER — FLUOXETINE HCL 40 MG PO CAPS: 40.0000 mg | ORAL_CAPSULE | Freq: Every day | ORAL | 1 refills | Status: AC

## 2024-04-08 MED ORDER — ARIPIPRAZOLE 10 MG PO TABS
10.0000 mg | ORAL_TABLET | Freq: Every morning | ORAL | 1 refills | Status: AC
  Filled 2024-04-08: qty 30, 30d supply, fill #0

## 2024-06-10 ENCOUNTER — Ambulatory Visit: Admitting: Podiatry
